# Patient Record
Sex: Male | Born: 1950
Health system: Southern US, Community
[De-identification: ages and names within clinical notes are randomized; demographics above are authoritative.]

## PROBLEM LIST (undated history)

## (undated) DIAGNOSIS — S83241A Other tear of medial meniscus, current injury, right knee, initial encounter: Secondary | ICD-10-CM

## (undated) DIAGNOSIS — M23261 Derangement of other lateral meniscus due to old tear or injury, right knee: Secondary | ICD-10-CM

## (undated) DIAGNOSIS — M1990 Unspecified osteoarthritis, unspecified site: Secondary | ICD-10-CM

## (undated) DIAGNOSIS — I499 Cardiac arrhythmia, unspecified: Secondary | ICD-10-CM

## (undated) HISTORY — DX: Other tear of medial meniscus, current injury, right knee, initial encounter: S83.241A

## (undated) HISTORY — PX: OTHER SURGICAL HISTORY: SHX169

## (undated) HISTORY — DX: Derangement of other lateral meniscus due to old tear or injury, right knee: M23.261

---

## 1985-04-08 HISTORY — PX: KNEE SURGERY: SHX244

## 1987-04-09 HISTORY — PX: VASECTOMY: SHX75

## 2018-01-19 ENCOUNTER — Ambulatory Visit (INDEPENDENT_AMBULATORY_CARE_PROVIDER_SITE_OTHER): Payer: Self-pay

## 2018-01-19 DIAGNOSIS — Z1211 Encounter for screening for malignant neoplasm of colon: Secondary | ICD-10-CM

## 2018-01-19 MED ORDER — NA SULFATE-K SULFATE-MG SULF 17.5-3.13-1.6 GM/177ML PO SOLN: 1.0000 | ORAL | 0 refills | Status: DC

## 2018-01-19 NOTE — Progress Notes (Signed)
Gastroenterology Pre-Procedure Review  Request Date:01/19/18 Requesting Physician: Dr.Gosrani- last tcs 6 years ago in [place], had polyps per pt. Not sure of the Dr's name. Will get Manuela Schwartz to attempt to get records.   PATIENT REVIEW QUESTIONS: The patient responded to the following health history questions as indicated:    1. Diabetes Melitis: no 2. Joint replacements in the past 12 months: no 3. Major health problems in the past 3 months: no 4. Has an artificial valve or MVP: no 5. Has a defibrillator: no 6. Has been advised in past to take antibiotics in advance of a procedure like teeth cleaning: no 7. Family history of colon cancer: no  8. Alcohol Use: yes (occasionally)  9. History of sleep apnea: no  10. History of coronary artery or other vascular stents placed within the last 12 months: no 11. History of any prior anesthesia complications: no    MEDICATIONS & ALLERGIES:    Patient reports the following regarding taking any blood thinners:   Plavix? no Aspirin? yes (81mg ) Coumadin? no Brilinta? no Xarelto? no Eliquis? no Pradaxa? no Savaysa? no Effient? no  Patient confirms/reports the following medications:  Current Outpatient Medications  Medication Sig Dispense Refill  . Ascorbic Acid (VITAMIN C) 100 MG tablet Take 100 mg by mouth daily.    Marland Kitchen aspirin EC 81 MG tablet Take 81 mg by mouth daily.    . cholecalciferol (VITAMIN D) 400 units TABS tablet Take 400 Units by mouth.    Marland Kitchen glucosamine-chondroitin 500-400 MG tablet Take 1 tablet by mouth 3 (three) times daily.    . psyllium (REGULOID) 0.52 g capsule Take 0.52 g by mouth daily.    Marland Kitchen testosterone cypionate (DEPOTESTOTERONE CYPIONATE) 100 MG/ML injection Inject into the muscle once a week. For IM use only     No current facility-administered medications for this visit.     Patient confirms/reports the following allergies:  Allergies  Allergen Reactions  . Amiodarone Rash  . Penicillins Rash    No  orders of the defined types were placed in this encounter.   AUTHORIZATION INFORMATION Primary Insurance: Unknown Jim #: [id number] Pre-Cert / Josem Kaufmann required: no   SCHEDULE INFORMATION: Procedure has been scheduled as follows:  Date: 03/10/18, Time: 7:30 Location: [facility] Dr.Rourk  This Gastroenterology Pre-Precedure Review Form is being routed to the following provider(s): Neil Crouch PA-C

## 2018-01-19 NOTE — Patient Instructions (Signed)
Harold West  05-Oct-1950 MRN: [id number]     Procedure Date: 03/10/18 Time to register: 6:30am Place to register: [facility] Procedure Time: 7:30am Scheduled provider: R. Garfield Cornea, MD    PREPARATION FOR COLONOSCOPY WITH SUPREP BOWEL PREP KIT  Note: Suprep Bowel Prep Kit is a split-dose (2day) regimen. Consumption of BOTH 6-ounce bottles is required for a complete prep.  Please notify us immediately if you are diabetic, take iron supplements, or if you are on Coumadin or any other blood thinners.                                                                                                                                                    2 DAYS BEFORE PROCEDURE:  DATE: 03/08/18   DAY: Sunday Begin clear liquid diet AFTER your lunch meal. NO SOLID FOODS after this point.  1 DAY BEFORE PROCEDURE:  DATE: 03/09/18   DAY: Monday Continue clear liquids the entire day - NO SOLID FOOD.    At 6:00pm: Complete steps 1 through 4 below, using ONE (1) 6-ounce bottle, before going to bed. Step 1:  Pour ONE (1) 6-ounce bottle of SUPREP liquid into the mixing container.  Step 2:  Add cool drinking water to the 16 ounce line on the container and mix.  Note: Dilute the solution concentrate as directed prior to use. Step 3:  DRINK ALL the liquid in the container. Step 4:  You MUST drink an additional two (2) or more 16 ounce containers of water over the next one (1) hour.   Continue clear liquids.  DAY OF PROCEDURE:   DATE: 03/10/18   DAY: Tuesday If you take medications for your heart, blood pressure, or breathing, you may take these medications.   5 hours before your procedure at :2:30am Step 1:  Pour ONE (1) 6-ounce bottle of SUPREP liquid into the mixing container.  Step 2:  Add cool drinking water to the 16 ounce line on the container and mix.  Note: Dilute the solution concentrate as directed prior to use. Step 3:  DRINK ALL the liquid in the container. Step 4:  You MUST  drink an additional two (2) or more 16 ounce containers of water over the next one (1) hour. You MUST complete the final glass of water at least 3 hours before your colonoscopy.   Nothing by mouth past 4:30am  You may take your morning medications with sip of water unless we have instructed otherwise.    Please see below for Dietary Information.  CLEAR LIQUIDS INCLUDE:  Water Jello (NOT red in color)   Ice Popsicles (NOT red in color)   Tea (sugar ok, no milk/cream) Powdered fruit flavored drinks  Coffee (sugar ok, no milk/cream) Gatorade/ Lemonade/ Kool-Aid  (NOT red in color)   Juice: apple, white grape, white cranberry Soft  drinks  Clear bullion, consomme, broth (fat free beef/chicken/vegetable)  Carbonated beverages (any kind)  Strained chicken noodle soup Hard Candy   Remember: Clear liquids are liquids that will allow you to see your fingers on the other side of a clear glass. Be sure liquids are NOT red in color, and not cloudy, but CLEAR.  DO NOT EAT OR DRINK ANY OF THE FOLLOWING:  Dairy products of any kind   Cranberry juice Tomato juice / V8 juice   Grapefruit juice Orange juice     Red grape juice  Do not eat any solid foods, including such foods as: cereal, oatmeal, yogurt, fruits, vegetables, creamed soups, eggs, bread, crackers, pureed foods in a blender, etc.   HELPFUL HINTS FOR DRINKING PREP SOLUTION:   Make sure prep is extremely cold. Mix and refrigerate the the morning of the prep. You may also put in the freezer.   You may try mixing some Crystal Light or Country Time Lemonade if you prefer. Mix in small amounts; add more if necessary.  Try drinking through a straw  Rinse mouth with water or a mouthwash between glasses, to remove after-taste.  Try sipping on a cold beverage /ice/ popsicles between glasses of prep.  Place a piece of sugar-free hard candy in mouth between glasses.  If you become nauseated, try consuming smaller amounts, or stretch out the  time between glasses. Stop for 30-60 minutes, then slowly start back drinking.     OTHER INSTRUCTIONS  You will need a responsible adult at least 66 years of age to accompany you and drive you home. This person must remain in the waiting room during your procedure. The hospital will cancel your procedure if you do not have a responsible adult with you.   1. Wear loose fitting clothing that is easily removed. 2. Leave jewelry and other valuables at home.  3. Remove all body piercing jewelry and leave at home. 4. Total time from sign-in until discharge is approximately 2-3 hours. 5. You should go home directly after your procedure and rest. You can resume normal activities the day after your procedure. 6. The day of your procedure you should not:  Drive  Make legal decisions  Operate machinery  Drink alcohol  Return to work   You may call the office (Dept: [phone]) before 5:00pm, or page the doctor on call [phone]) after 5:00pm, for further instructions, if necessary.   Insurance Information YOU WILL NEED TO CHECK WITH YOUR INSURANCE COMPANY FOR THE BENEFITS OF COVERAGE YOU HAVE FOR THIS PROCEDURE.  UNFORTUNATELY, NOT ALL INSURANCE COMPANIES HAVE BENEFITS TO COVER ALL OR PART OF THESE TYPES OF PROCEDURES.  IT IS YOUR RESPONSIBILITY TO CHECK YOUR BENEFITS, HOWEVER, WE WILL BE GLAD TO ASSIST YOU WITH ANY CODES YOUR INSURANCE COMPANY MAY NEED.    PLEASE NOTE THAT MOST INSURANCE COMPANIES WILL NOT COVER A SCREENING COLONOSCOPY FOR PEOPLE UNDER THE AGE OF 50  IF YOU HAVE BCBS INSURANCE, YOU MAY HAVE BENEFITS FOR A SCREENING COLONOSCOPY BUT IF POLYPS ARE FOUND THE DIAGNOSIS WILL CHANGE AND THEN YOU MAY HAVE A DEDUCTIBLE THAT WILL NEED TO BE MET. SO PLEASE MAKE SURE YOU CHECK YOUR BENEFITS FOR A SCREENING COLONOSCOPY AS WELL AS A DIAGNOSTIC COLONOSCOPY.

## 2018-01-20 NOTE — Progress Notes (Signed)
Ok to schedule.

## 2018-03-10 ENCOUNTER — Encounter (HOSPITAL_COMMUNITY): Payer: Self-pay | Admitting: *Deleted

## 2018-03-10 ENCOUNTER — Ambulatory Visit (HOSPITAL_COMMUNITY)
Admission: RE | Admit: 2018-03-10 | Discharge: 2018-03-10 | Disposition: A | Discharge status: Home / Self Care | Payer: Managed Care, Other (non HMO) | Source: Ambulatory Visit | Attending: Internal Medicine | Admitting: Internal Medicine

## 2018-03-10 ENCOUNTER — Other Ambulatory Visit: Payer: Self-pay

## 2018-03-10 ENCOUNTER — Encounter (HOSPITAL_COMMUNITY)
Admission: RE | Disposition: A | Discharge status: Home / Self Care | Payer: Self-pay | Source: Ambulatory Visit | Attending: Internal Medicine

## 2018-03-10 DIAGNOSIS — Z79899 Other long term (current) drug therapy: Secondary | ICD-10-CM | POA: Diagnosis not present

## 2018-03-10 DIAGNOSIS — Z1211 Encounter for screening for malignant neoplasm of colon: Secondary | ICD-10-CM | POA: Diagnosis present

## 2018-03-10 DIAGNOSIS — D120 Benign neoplasm of cecum: Secondary | ICD-10-CM | POA: Diagnosis not present

## 2018-03-10 DIAGNOSIS — Z86010 Personal history of colonic polyps: Secondary | ICD-10-CM | POA: Diagnosis not present

## 2018-03-10 DIAGNOSIS — Z7982 Long term (current) use of aspirin: Secondary | ICD-10-CM | POA: Diagnosis not present

## 2018-03-10 DIAGNOSIS — Z9079 Acquired absence of other genital organ(s): Secondary | ICD-10-CM | POA: Diagnosis not present

## 2018-03-10 HISTORY — PX: POLYPECTOMY: SHX5525

## 2018-03-10 HISTORY — PX: COLONOSCOPY: SHX5424

## 2018-03-10 SURGERY — COLONOSCOPY
Anesthesia: Moderate Sedation

## 2018-03-10 MED ORDER — MEPERIDINE HCL 50 MG/ML IJ SOLN
INTRAMUSCULAR | Status: AC
  Filled 2018-03-10: qty 1

## 2018-03-10 MED ORDER — MEPERIDINE HCL 100 MG/ML IJ SOLN
INTRAMUSCULAR | Status: DC | PRN
  Administered 2018-03-10: 25 mg

## 2018-03-10 MED ORDER — MIDAZOLAM HCL 5 MG/5ML IJ SOLN
INTRAMUSCULAR | Status: AC
  Filled 2018-03-10: qty 10

## 2018-03-10 MED ORDER — MIDAZOLAM HCL 5 MG/5ML IJ SOLN
INTRAMUSCULAR | Status: DC | PRN
  Administered 2018-03-10: 1 mg via INTRAVENOUS
  Administered 2018-03-10: 2 mg via INTRAVENOUS
  Administered 2018-03-10: 1 mg via INTRAVENOUS

## 2018-03-10 MED ORDER — STERILE WATER FOR IRRIGATION IR SOLN
Status: DC | PRN
  Administered 2018-03-10: 1.5 mL

## 2018-03-10 MED ORDER — ONDANSETRON HCL 4 MG/2ML IJ SOLN
INTRAMUSCULAR | Status: AC
  Filled 2018-03-10: qty 2

## 2018-03-10 MED ORDER — SODIUM CHLORIDE 0.9 % IV SOLN
INTRAVENOUS | Status: DC
  Administered 2018-03-10: 07:00:00 via INTRAVENOUS

## 2018-03-10 MED ORDER — ONDANSETRON HCL 4 MG/2ML IJ SOLN
INTRAMUSCULAR | Status: DC | PRN
  Administered 2018-03-10: 4 mg via INTRAVENOUS

## 2018-03-10 NOTE — Op Note (Signed)
[facility] Patient Name: Harold West Procedure Date: 03/10/2018 7:18 AM MRN: [id number] Date of Birth: 1950-08-12 Attending MD: Norvel Richards , MD CSN: [id number] Age: 67 Admit Type: Outpatient Procedure:                Colonoscopy Indications:              High risk colon cancer surveillance: Personal                            history of colonic polyps Providers:                Norvel Richards, MD, Lurline Del, RN, Gerome Sam, RN Referring MD:              Medicines:                Midazolam 4 mg IV, Meperidine 25 mg IV, Ondansetron                            4 mg IV Complications:            No immediate complications. Estimated Blood Loss:     Estimated blood loss: none. Procedure:                Pre-Anesthesia Assessment:                           - Prior to the procedure, a History and Physical                            was performed, and patient medications and                            allergies were reviewed. The patient's tolerance of                            previous anesthesia was also reviewed. The risks                            and benefits of the procedure and the sedation                            options and risks were discussed with the patient.                            All questions were answered, and informed consent                            was obtained. Prior Anticoagulants: The patient has                            taken no previous anticoagulant or antiplatelet  agents. ASA Grade Assessment: II - A patient with                            mild systemic disease. After reviewing the risks                            and benefits, the patient was deemed in                            satisfactory condition to undergo the procedure.                           After obtaining informed consent, the colonoscope                            was passed under direct vision. Throughout the                             procedure, the patient's blood pressure, pulse, and                            oxygen saturations were monitored continuously. The                            CF-HQ190L ([id number]) scope was introduced through                            the anus and advanced to the the terminal ileum,                            with identification of the appendiceal orifice and                            IC valve. The colonoscopy was performed without                            difficulty. The patient tolerated the procedure                            well. The quality of the bowel preparation was                            adequate. The ileocecal valve, appendiceal orifice,                            and rectum were photographed. The entire colon was                            well visualized. Scope In: 7:46:22 AM Scope Out: 8:01:30 AM Scope Withdrawal Time: 0 hours 10 minutes 9 seconds  Total Procedure Duration: 0 hours 15 minutes 8 seconds  Findings:      The perianal and digital rectal examinations were normal.      A 3 mm polyp was found in  the cecum. The polyp was sessile. The polyp       was removed with a cold snare. Resection and retrieval were complete.       Estimated blood loss was minimal.      The exam was otherwise without abnormality on direct and retroflexion       views. Impression:               - One 3 mm polyp in the cecum, removed with a cold                            snare. Resected and retrieved.                           - The examination was otherwise normal on direct                            and retroflexion views. Moderate Sedation:      Moderate (conscious) sedation was administered by the endoscopy nurse       and supervised by the endoscopist. The following parameters were       monitored: oxygen saturation, heart rate, blood pressure, respiratory       rate, EKG, adequacy of pulmonary ventilation, and response to care.       Total physician  intraservice time was 21 minutes. Recommendation:           - Patient has a contact number available for                            emergencies. The signs and symptoms of potential                            delayed complications were discussed with the                            patient. Return to normal activities tomorrow.                            Written discharge instructions were provided to the                            patient.                           - Resume previous diet.                           - Repeat colonoscopy date to be determined after                            pending pathology results are reviewed for                            surveillance.                           - Return to GI clinic (date not yet determined). Procedure Code(s):        ---  Professional ---                           [phone], Colonoscopy, flexible; with removal of                            tumor(s), polyp(s), or other lesion(s) by snare                            technique                           G0500, Moderate sedation services provided by the                            same physician or other qualified health care                            professional performing a gastrointestinal                            endoscopic service that sedation supports,                            requiring the presence of an independent trained                            observer to assist in the monitoring of the                            patient's level of consciousness and physiological                            status; initial 15 minutes of intra-service time;                            patient age 60 years or older (additional time may                            be reported with [phone], as appropriate) Diagnosis Code(s):        --- Professional ---                           Z86.010, Personal history of colonic polyps                           D12.0, Benign neoplasm of cecum CPT copyright 2018  American Medical Association. All rights reserved. The codes documented in this report are preliminary and upon coder review may  be revised to meet current compliance requirements. Harold West. Harold Oddo, MD Norvel Richards, MD 03/10/2018 8:06:27 AM This report has been signed electronically. Number of Addenda: 0

## 2018-03-10 NOTE — H&P (Signed)
_0 @   Primary Care Physician:  Doree Albee, MD Primary Gastroenterologist:  Dr. Gala Romney  Pre-Procedure History & Physical: HPI:  Harold West is a 67 y.o. male here for surveillance colonoscopy;  Last colonoscopy [place].No bowel symptoms.  Polyps removed previously.  History reviewed. No pertinent past medical history.  Past Surgical History:  Procedure Laterality Date  . KNEE SURGERY Right 1987  . Testicle removed Bilateral   . VASECTOMY  1989    Prior to Admission medications   Medication Sig Start Date End Date Taking? Authorizing Provider  Ascorbic Acid (VITAMIN C PO) Take 1 tablet by mouth daily.   Yes [provider]  aspirin EC 81 MG tablet Take 81 mg by mouth daily.   Yes [provider]  Calcium Polycarbophil (FIBER-CAPS PO) Take 5 capsules by mouth daily.   Yes [provider]  Cholecalciferol (VITAMIN D3 PO) Take 1 tablet by mouth daily.   Yes [provider]  Glucosamine HCl (GLUCOSAMINE PO) Take 1 tablet by mouth daily.   Yes [provider]  Na Sulfate-K Sulfate-Mg Sulf (SUPREP BOWEL PREP KIT) 17.5-3.13-1.6 GM/177ML SOLN Take 1 kit by mouth as directed. 01/19/18  Yes Mahala Menghini, PA-C  testosterone cypionate (DEPOTESTOSTERONE CYPIONATE) 200 MG/ML injection Inject 100 mg into the muscle every Thursday. 12/02/17  Yes [provider]    Allergies as of 01/19/2018 - Review Complete 01/19/2018  Allergen Reaction Noted  . Amiodarone Rash 01/19/2018  . Penicillins Rash 01/19/2018    Family History  Problem Relation Age of Onset  . Hypertension Mother   . Diabetes Mother   . Heart disease Father     Social History   Socioeconomic History  . Marital status: Married    Spouse name: Not on file  . Number of children: Not on file  . Years of education: Not on file  . Highest education level: Not on file  Occupational History  . Not on file  Social Needs  . Financial resource strain: Not  on file  . Food insecurity:    Worry: Not on file    Inability: Not on file  . Transportation needs:    Medical: Not on file    Non-medical: Not on file  Tobacco Use  . Smoking status: Never Smoker  . Smokeless tobacco: Never Used  Substance and Sexual Activity  . Alcohol use: Yes    Comment: Occasional  . Drug use: Never  . Sexual activity: Not on file  Lifestyle  . Physical activity:    Days per week: Not on file    Minutes per session: Not on file  . Stress: Not on file  Relationships  . Social connections:    Talks on phone: Not on file    Gets together: Not on file    Attends religious service: Not on file    Active member of club or organization: Not on file    Attends meetings of clubs or organizations: Not on file    Relationship status: Not on file  . Intimate partner violence:    Fear of current or ex partner: Not on file    Emotionally abused: Not on file    Physically abused: Not on file    Forced sexual activity: Not on file  Other Topics Concern  . Not on file  Social History Narrative  . Not on file    Review of Systems: See HPI, otherwise negative ROS  Physical Exam: BP 115/62   Pulse  73   Temp 97.8 F (36.6 C) (Oral)   Resp 14   Ht 6' 2.5" (1.892 m)   Wt 108.9 kg   SpO2 97%   BMI 30.40 kg/m  General:   Alert,  Well-developed, well-nourished, pleasant and cooperative in NAD Neck:  Supple; no masses or thyromegaly. No significant cervical adenopathy. Lungs:  Clear throughout to auscultation.   No wheezes, crackles, or rhonchi. No acute distress. Heart:  Regular rate and rhythm; no murmurs, clicks, rubs,  or gallops. Abdomen: Non-distended, normal bowel sounds.  Soft and nontender without appreciable mass or hepatosplenomegaly.  Pulses:  Normal pulses noted. Extremities:  Without clubbing or edema.  Impression/Plan:  Here for surveillance colonoscopy.  The risks, benefits, limitations, alternatives and imponderables have been reviewed with  the patient. Questions have been answered. All parties are agreeable.      Notice: This dictation was prepared with Dragon dictation along with smaller phrase technology. Any transcriptional errors that result from this process are unintentional and may not be corrected upon review.

## 2018-03-10 NOTE — Discharge Instructions (Signed)
Colon Polyps °Polyps are tissue growths inside the body. Polyps can grow in many places, including the large intestine (colon). A polyp may be a round bump or a mushroom-shaped growth. You could have one polyp or several. °Most colon polyps are noncancerous (benign). However, some colon polyps can become cancerous over time. °What are the causes? °The exact cause of colon polyps is not known. °What increases the risk? °This condition is more likely to develop in people who: °· Have a family history of colon cancer or colon polyps. °· Are older than 50 or older than 45 if they are African American. °· Have inflammatory bowel disease, such as ulcerative colitis or Crohn disease. °· Are overweight. °· Smoke cigarettes. °· Do not get enough exercise. °· Drink too much alcohol. °· Eat a diet that is: °? High in fat and red meat. °? Low in fiber. °· Had childhood cancer that was treated with abdominal radiation. ° °What are the signs or symptoms? °Most polyps do not cause symptoms. If you have symptoms, they may include: °· Blood coming from your rectum when having a bowel movement. °· Blood in your stool. The stool may look dark red or black. °· A change in bowel habits, such as constipation or diarrhea. ° °How is this diagnosed? °This condition is diagnosed with a colonoscopy. This is a procedure that uses a lighted, flexible scope to look at the inside of your colon. °How is this treated? °Treatment for this condition involves removing any polyps that are found. Those polyps will then be tested for cancer. If cancer is found, your health care provider will talk to you about options for colon cancer treatment. °Follow these instructions at home: °Diet °· Eat plenty of fiber, such as fruits, vegetables, and whole grains. °· Eat foods that are high in calcium and vitamin D, such as milk, cheese, yogurt, eggs, liver, fish, and broccoli. °· Limit foods high in fat, red meats, and processed meats, such as hot dogs, sausage,  bacon, and lunch meats. °· Maintain a healthy weight, or lose weight if recommended by your health care provider. °General instructions °· Do not smoke cigarettes. °· Do not drink alcohol excessively. °· Keep all follow-up visits as told by your health care provider. This is important. This includes keeping regularly scheduled colonoscopies. Talk to your health care provider about when you need a colonoscopy. °· Exercise every day or as told by your health care provider. °Contact a health care provider if: °· You have new or worsening bleeding during a bowel movement. °· You have new or increased blood in your stool. °· You have a change in bowel habits. °· You unexpectedly lose weight. °This information is not intended to replace advice given to you by your health care provider. Make sure you discuss any questions you have with your health care provider. °Document Released: 12/20/2003 Document Revised: 08/31/2015 Document Reviewed: 02/13/2015 °Elsevier Interactive Patient Education © 2018 [employer] ° °Colonoscopy °Discharge Instructions ° °Read the instructions outlined below and refer to this sheet in the next few weeks. These discharge instructions provide you with general information on caring for yourself after you leave the hospital. Your doctor may also give you specific instructions. While your treatment has been planned according to the most current medical practices available, unavoidable complications occasionally occur. If you have any problems or questions after discharge, call Dr. Rourk at [phone]. °ACTIVITY °· You may resume your regular activity, but move at a slower pace for the next 24   hours.  °· Take frequent rest periods for the next 24 hours.  °· Walking will help get rid of the air and reduce the bloated feeling in your belly (abdomen).  °· No driving for 24 hours (because of the medicine (anesthesia) used during the test).   °· Do not sign any important legal documents or operate any  machinery for 24 hours (because of the anesthesia used during the test).  °NUTRITION °· Drink plenty of fluids.  °· You may resume your normal diet as instructed by your doctor.  °· Begin with a light meal and progress to your normal diet. Heavy or fried foods are harder to digest and may make you feel sick to your stomach (nauseated).  °· Avoid alcoholic beverages for 24 hours or as instructed.  °MEDICATIONS °· You may resume your normal medications unless your doctor tells you otherwise.  °WHAT YOU CAN EXPECT TODAY °· Some feelings of bloating in the abdomen.  °· Passage of more gas than usual.  °· Spotting of blood in your stool or on the toilet paper.  °IF YOU HAD POLYPS REMOVED DURING THE COLONOSCOPY: °· No aspirin products for 7 days or as instructed.  °· No alcohol for 7 days or as instructed.  °· Eat a soft diet for the next 24 hours.  °FINDING OUT THE RESULTS OF YOUR TEST °Not all test results are available during your visit. If your test results are not back during the visit, make an appointment with your caregiver to find out the results. Do not assume everything is normal if you have not heard from your caregiver or the medical facility. It is important for you to follow up on all of your test results.  °SEEK IMMEDIATE MEDICAL ATTENTION IF: °· You have more than a spotting of blood in your stool.  °· Your belly is swollen (abdominal distention).  °· You are nauseated or vomiting.  °· You have a temperature over 101.  °· You have abdominal pain or discomfort that is severe or gets worse throughout the day.  ° ° ° °Colon polyp information provided ° °Further recommendations to follow pending review of pathology report °

## 2018-03-12 ENCOUNTER — Encounter: Payer: Self-pay | Admitting: Internal Medicine

## 2018-03-23 ENCOUNTER — Encounter (HOSPITAL_COMMUNITY): Payer: Self-pay | Admitting: Internal Medicine

## 2018-06-15 ENCOUNTER — Encounter (INDEPENDENT_AMBULATORY_CARE_PROVIDER_SITE_OTHER): Payer: Self-pay | Admitting: Internal Medicine

## 2018-07-02 ENCOUNTER — Encounter (INDEPENDENT_AMBULATORY_CARE_PROVIDER_SITE_OTHER): Payer: Self-pay | Admitting: Internal Medicine

## 2018-07-08 ENCOUNTER — Ambulatory Visit (INDEPENDENT_AMBULATORY_CARE_PROVIDER_SITE_OTHER): Payer: Managed Care, Other (non HMO) | Admitting: Internal Medicine

## 2018-11-03 ENCOUNTER — Other Ambulatory Visit: Payer: Managed Care, Other (non HMO)

## 2018-11-03 ENCOUNTER — Other Ambulatory Visit: Payer: Self-pay

## 2018-11-03 DIAGNOSIS — Z20822 Contact with and (suspected) exposure to covid-19: Secondary | ICD-10-CM

## 2018-11-05 LAB — NOVEL CORONAVIRUS, NAA: SARS-CoV-2, NAA: NOT DETECTED

## 2019-01-06 ENCOUNTER — Ambulatory Visit (INDEPENDENT_AMBULATORY_CARE_PROVIDER_SITE_OTHER): Payer: Managed Care, Other (non HMO) | Admitting: Internal Medicine

## 2019-02-24 ENCOUNTER — Ambulatory Visit (INDEPENDENT_AMBULATORY_CARE_PROVIDER_SITE_OTHER): Payer: Medicare HMO | Admitting: Internal Medicine

## 2019-02-24 ENCOUNTER — Encounter (INDEPENDENT_AMBULATORY_CARE_PROVIDER_SITE_OTHER): Payer: Self-pay | Admitting: Internal Medicine

## 2019-02-24 ENCOUNTER — Other Ambulatory Visit: Payer: Self-pay

## 2019-02-24 DIAGNOSIS — E559 Vitamin D deficiency, unspecified: Secondary | ICD-10-CM | POA: Diagnosis not present

## 2019-02-24 DIAGNOSIS — E291 Testicular hypofunction: Secondary | ICD-10-CM | POA: Diagnosis not present

## 2019-02-24 DIAGNOSIS — E669 Obesity, unspecified: Secondary | ICD-10-CM | POA: Diagnosis not present

## 2019-02-24 DIAGNOSIS — Z23 Encounter for immunization: Secondary | ICD-10-CM | POA: Diagnosis not present

## 2019-02-24 HISTORY — DX: Obesity, unspecified: E66.9

## 2019-02-24 HISTORY — DX: Vitamin D deficiency, unspecified: E55.9

## 2019-02-24 HISTORY — DX: Testicular hypofunction: E29.1

## 2019-02-24 NOTE — Patient Instructions (Signed)
[facility] Dietary Recommendations for Weight Loss What to Avoid . Avoid added sugars o Often added sugar can be found in processed foods such as many condiments, dry cereals, cakes, cookies, chips, crisps, crackers, candies, sweetened drinks, etc.  o Read labels and AVOID/DECREASE use of foods with the following in their ingredient list: Sugar, fructose, high fructose corn syrup, sucrose, glucose, maltose, dextrose, molasses, cane sugar, brown sugar, any type of syrup, agave nectar, etc.   . Avoid snacking in between meals . Avoid foods made with flour o If you are going to eat food made with flour, choose those made with whole-grains; and, minimize your consumption as much as is tolerable . Avoid processed foods o These foods are generally stocked in the middle of the grocery store. Focus on shopping on the perimeter of the grocery.  What to Include . Vegetables o GREEN LEAFY VEGETABLES: Kale, spinach, mustard greens, collard greens, cabbage, broccoli, etc. o OTHER: Asparagus, cauliflower, eggplant, carrots, peas, Brussel sprouts, tomatoes, bell peppers, zucchini, beets, cucumbers, etc. . Grains, seeds, and legumes o Beans: kidney beans, black eyed peas, garbanzo beans, black beans, pinto beans, etc. o Whole, unrefined grains: brown rice, barley, bulgur, oatmeal, etc. . Healthy fats  o Avoid highly processed fats such as vegetable oil o Examples of healthy fats: avocado, olives, virgin olive oil, dark chocolate (?72% Cocoa), nuts (peanuts, almonds, walnuts, cashews, pecans, etc.) . Low - Moderate Intake of Animal Sources of Protein o Meat sources: chicken, turkey, salmon, tuna. Limit to 4 ounces of meat at one time. o Consider limiting dairy sources, but when choosing dairy focus on: PLAIN Greek yogurt, cottage cheese, high-protein milk . Fruit o Choose berries  When to Eat . Intermittent Fasting: o Choosing not to eat for a specific time period, but DO FOCUS ON HYDRATION  when fasting o Multiple Techniques: - Time Restricted Eating: eat 3 meals in a day, each meal lasting no more than 60 minutes, no snacks between meals - 16-18 hour fast: fast for 16 to 18 hours up to 7 days a week. Often suggested to start with 2-3 nonconsecutive days per week.  . Remember the time you sleep is counted as fasting.  . Examples of eating schedule: Fast from 7:00pm-11:00am. Eat between 11:00am-7:00pm.  - 24-hour fast: fast for 24 hours up to every other day. Often suggested to start with 1 day per week . Remember the time you sleep is counted as fasting . Examples of eating schedule:  o Eating day: eat 2-3 meals on your eating day. If doing 2 meals, each meal should last no more than 90 minutes. If doing 3 meals, each meal should last no more than 60 minutes. Finish last meal by 7:00pm. o Fasting day: Fast until 7:00pm.  o IF YOU FEEL UNWELL FOR ANY REASON/IN ANY WAY WHEN FASTING, STOP FASTING BY EATING A NUTRITIOUS SNACK OR LIGHT MEAL o ALWAYS FOCUS ON HYDRATION DURING FASTS - Acceptable Hydration sources: water, broths, tea/coffee (black tea/coffee is best but using a small amount of whole-fat dairy products in coffee/tea is acceptable).  - Poor Hydration Sources: anything with sugar or artificial sweeteners added to it  These recommendations have been developed for patients that are actively receiving medical care from either Dr.  or Sarah Gray, DNP, NP-C at  [facility]. These recommendations are developed for patients with specific medical conditions and are not meant to be distributed or used by others that are not actively receiving care from either provider listed   above at  [facility]. It is not appropriate to participate in the above eating plans without proper medical supervision.   Reference: Fung, J. The obesity code. [place]: [place]; 2016.   

## 2019-02-24 NOTE — Progress Notes (Signed)
Metrics: Intervention Frequency ACO  Documented Smoking Status Yearly  Screened one or more times in 24 months  Cessation Counseling or  Active cessation medication Past 24 months  Past 24 months   Guideline developer: UpToDate (See UpToDate for funding source) Date Released: 2014       Wellness Office Visit  Subjective:  Patient ID: Harold West, male    DOB: 04/10/1950  Age: 68 y.o. MRN: [id number]  CC: This man comes in for follow-up of hypogonadism, obesity and vitamin D deficiency. HPI  Unfortunately, he has gained weight and he admits he has not been consistent with nutrition and has not been exercising either. He is tolerating testosterone therapy twice a week but has not noticed improvement in sex drive. He is taking vitamin D3 supplementation for vitamin D deficiency. Past Medical History:  Diagnosis Date  . Obesity (BMI 30-39.9) 02/24/2019  . Testicular failure 02/24/2019  . Vitamin D deficiency disease 02/24/2019      Family History  Problem Relation Age of Onset  . Hypertension Mother   . Diabetes Mother   . Heart disease Father     Social History   Social History Narrative   Married for 43 years.Lives with wife.Retired August 2020.   Social History   Tobacco Use  . Smoking status: Never Smoker  . Smokeless tobacco: Never Used  Substance Use Topics  . Alcohol use: Yes    Comment: Occasional    Current Meds  Medication Sig  . Ascorbic Acid (VITAMIN C PO) Take 1 tablet by mouth daily.  Marland Kitchen aspirin EC 81 MG tablet Take 81 mg by mouth daily.  . Calcium Polycarbophil (FIBER-CAPS PO) Take 5 capsules by mouth daily.  . Cholecalciferol (VITAMIN D3) 125 MCG (5000 UT) TABS Take 1 tablet by mouth daily.   . Glucosamine HCl (GLUCOSAMINE PO) Take 1 tablet by mouth daily.  Marland Kitchen testosterone cypionate (DEPOTESTOSTERONE CYPIONATE) 200 MG/ML injection Inject 100 mg into the muscle 2 (two) times a week.      Nutrition  Not consistent. Sleep  Adequate.   Exercise  None regular. Bio Identical Hormones  Testosterone therapy is being used off label for symptoms of testosterone deficiency and benefits that it produces based on several studies.  These benefits include decreasing body fat, increasing in lean muscle mass and increasing in bone density.  There is improvement of memory, cognition.  There is improvement in exercise tolerance and endurance.  Testosterone therapy has also been shown to be protective against coronary artery disease, cerebrovascular disease, diabetes, hypertension and degenerative joint disease. I have discussed with the patient the FDA warnings regarding testosterone therapy, benefits and side effects and modes of administration as well as monitoring blood levels and side effects  on a regular basis The patient is agreeable that testosterone therapy should be an integral part of his/her wellness,quality of life and prevention of chronic disease.  Objective:   Today's Vitals: BP 120/80 (BP Location: Right Arm, Patient Position: Sitting, Cuff Size: Large)   Pulse 72   Temp (!) 97.3 F (36.3 C) (Temporal)   Resp 18   Ht 6\' 3"  (1.905 m)   Wt 293 lb (132.9 kg)   SpO2 96%   BMI 36.62 kg/m  Vitals with BMI 02/24/2019 06/02/2018 03/10/2018  Height 6\' 3"  6\' 3"  -  Weight 293 lbs 278 lbs 10 oz -  BMI 99991111 123XX123 -  Systolic 123456 0000000 [id number]  Diastolic 80 78 59  Pulse 72 84 -     Physical  Exam    He looks systemically well but he has gained 15 pounds since February of this year.  He is alert and orientated without any focal neurological signs.   Assessment   1. Need for immunization against influenza   2. Testicular failure   3. Vitamin D deficiency disease   4. Obesity (BMI 30-39.9)       Tests ordered Orders Placed This Encounter  Procedures  . Flu Vaccine QUAD High Dose(Fluad)     Plan: 1. He will continue with testosterone therapy for the time being. 2. I think the main focus should be for him to lose  weight and we discussed again nutrition and spent most of the visit discussing this. 3. I do not think he requires any blood work at this present time.  High-dose influenza vaccination was given today. 4. Follow-up in about 4 months time. 5. Today I spent 25 to 30 minutes with this patient face-to-face, more than 50% of the time was involved in discussing nutrition and the importance of losing weight.   No orders of the defined types were placed in this encounter.   Doree Albee, MD

## 2019-05-31 DIAGNOSIS — R69 Illness, unspecified: Secondary | ICD-10-CM | POA: Diagnosis not present

## 2019-06-12 ENCOUNTER — Other Ambulatory Visit (INDEPENDENT_AMBULATORY_CARE_PROVIDER_SITE_OTHER): Payer: Self-pay | Admitting: Internal Medicine

## 2019-06-24 ENCOUNTER — Ambulatory Visit (INDEPENDENT_AMBULATORY_CARE_PROVIDER_SITE_OTHER): Payer: Medicare HMO | Admitting: Internal Medicine

## 2019-07-07 ENCOUNTER — Other Ambulatory Visit: Payer: Self-pay

## 2019-07-07 ENCOUNTER — Encounter (INDEPENDENT_AMBULATORY_CARE_PROVIDER_SITE_OTHER): Payer: Self-pay | Admitting: Internal Medicine

## 2019-07-07 ENCOUNTER — Ambulatory Visit (INDEPENDENT_AMBULATORY_CARE_PROVIDER_SITE_OTHER): Payer: Medicare HMO | Admitting: Internal Medicine

## 2019-07-07 VITALS — BP 140/100 | HR 73 | Temp 97.2°F | Ht 74.5 in | Wt 308.6 lb

## 2019-07-07 DIAGNOSIS — E291 Testicular hypofunction: Secondary | ICD-10-CM

## 2019-07-07 DIAGNOSIS — R5383 Other fatigue: Secondary | ICD-10-CM | POA: Diagnosis not present

## 2019-07-07 DIAGNOSIS — R5381 Other malaise: Secondary | ICD-10-CM | POA: Diagnosis not present

## 2019-07-07 DIAGNOSIS — E559 Vitamin D deficiency, unspecified: Secondary | ICD-10-CM

## 2019-07-07 NOTE — Progress Notes (Signed)
Metrics: Intervention Frequency ACO  Documented Smoking Status Yearly  Screened one or more times in 24 months  Cessation Counseling or  Active cessation medication Past 24 months  Past 24 months   Guideline developer: UpToDate (See UpToDate for funding source) Date Released: 2014       Wellness Office Visit  Subjective:  Patient ID: Harold West, male    DOB: 22-Dec-1950  Age: 69 y.o. MRN: [id number]  CC: This man comes in for follow-up of hypogonadism, obesity and vitamin D deficiency. HPI  He has gained weight and he realizes he has not been eating healthy foods.  His weakness is candy/sugary foods. He continues on testosterone therapy which he is tolerating very well.  Past Medical History:  Diagnosis Date  . Obesity (BMI 30-39.9) 02/24/2019  . Testicular failure 02/24/2019  . Vitamin D deficiency disease 02/24/2019      Family History  Problem Relation Age of Onset  . Hypertension Mother   . Diabetes Mother   . Heart disease Father     Social History   Social History Narrative   Married for 43 years.Lives with wife.Retired August 2020.   Social History   Tobacco Use  . Smoking status: Never Smoker  . Smokeless tobacco: Never Used  Substance Use Topics  . Alcohol use: Yes    Comment: Occasional    Current Meds  Medication Sig  . Ascorbic Acid (VITAMIN C PO) Take 1 tablet by mouth daily.  Marland Kitchen aspirin EC 81 MG tablet Take 81 mg by mouth daily.  . Calcium Polycarbophil (FIBER-CAPS PO) Take 5 capsules by mouth daily.  . Cholecalciferol (VITAMIN D3) 125 MCG (5000 UT) TABS Take 1 tablet by mouth daily.   . Glucosamine HCl (GLUCOSAMINE PO) Take 1 tablet by mouth daily.  Marland Kitchen testosterone cypionate (DEPOTESTOSTERONE CYPIONATE) 200 MG/ML injection INJECT 0.5 ML INTO THE MUSCLE TWICE A WEEK       Objective:   Today's Vitals: BP (!) 140/100 (BP Location: Right Arm, Patient Position: Sitting, Cuff Size: Normal)   Pulse 73   Temp (!) 97.2 F (36.2 C) (Temporal)    Ht 6' 2.5" (1.892 m)   Wt (!) 308 lb 9.6 oz (140 kg)   SpO2 96%   BMI 39.09 kg/m  Vitals with BMI 07/07/2019 02/24/2019 06/02/2018  Height 6' 2.5" 6\' 3"  6\' 3"   Weight 308 lbs 10 oz 293 lbs 278 lbs 10 oz  BMI 39.1 99991111 123XX123  Systolic [id number] 123456 0000000  Diastolic 123XX123 80 78  Pulse 73 72 84     Physical Exam   He looks systemically well but he has gained 15 pounds since the last visit in November.  He is almost morbidly obese now.  Blood pressure is excellent.    Assessment   1. Testicular failure   2. Vitamin D deficiency disease   3. Malaise and fatigue       Tests ordered Orders Placed This Encounter  Procedures  . COMPLETE METABOLIC PANEL WITH GFR  . CBC  . T3, free  . Testosterone Total,Free,Bio, Males  . VITAMIN D 25 Hydroxy (Vit-D Deficiency, Fractures)     Plan: 1. Blood work is ordered above. 2. He will continue with testosterone therapy for the time being we will see if we need to adjust the levels. 3. Further recommendations will depend on blood results.  We discussed nutrition again and he is definitely keen to go back to possibly a ketogenic diet.  We talked about intermittent fasting and also  a plant-based diet. 4. Follow-up for an annual physical exam in 4 months time.  I spent 30 minutes with this patient today discussing the above.   No orders of the defined types were placed in this encounter.   Doree Albee, MD

## 2019-07-08 LAB — COMPLETE METABOLIC PANEL WITH GFR
AG Ratio: 1.8 (calc) (ref 1.0–2.5)
ALT: 23 U/L (ref 9–46)
AST: 20 U/L (ref 10–35)
Albumin: 4.4 g/dL (ref 3.6–5.1)
Alkaline phosphatase (APISO): 52 U/L (ref 35–144)
BUN: 16 mg/dL (ref 7–25)
CO2: 31 mmol/L (ref 20–32)
Calcium: 10.2 mg/dL (ref 8.6–10.3)
Chloride: 103 mmol/L (ref 98–110)
Creat: 1 mg/dL (ref 0.70–1.25)
GFR, Est African American: 90 mL/min/{1.73_m2} (ref >=60)
GFR, Est Non African American: 78 mL/min/{1.73_m2} (ref >=60)
Globulin: 2.5 g/dL (calc) (ref 1.9–3.7)
Glucose, Bld: 92 mg/dL (ref 65–99)
Potassium: 4.6 mmol/L (ref 3.5–5.3)
Sodium: 141 mmol/L (ref 135–146)
Total Bilirubin: 0.8 mg/dL (ref 0.2–1.2)
Total Protein: 6.9 g/dL (ref 6.1–8.1)

## 2019-07-08 LAB — TESTOSTERONE TOTAL,FREE,BIO, MALES
Albumin: 4.4 g/dL (ref 3.6–5.1)
Sex Hormone Binding: 34 nmol/L (ref 22–77)
Testosterone, Bioavailable: 66.2 ng/dL — ABNORMAL LOW (ref >=110.0)
Testosterone, Free: 32.9 pg/mL — ABNORMAL LOW (ref 46.0–224.0)
Testosterone: 267 ng/dL (ref 250–827)

## 2019-07-08 LAB — CBC
HCT: 45.5 % (ref 38.5–50.0)
Hemoglobin: 14.9 g/dL (ref 13.2–17.1)
MCH: 27.3 pg (ref 27.0–33.0)
MCHC: 32.7 g/dL (ref 32.0–36.0)
MCV: 83.5 fL (ref 80.0–100.0)
MPV: 10.9 fL (ref 7.5–12.5)
Platelets: 212 10*3/uL (ref 140–400)
RBC: 5.45 10*6/uL (ref 4.20–5.80)
RDW: 13.9 % (ref 11.0–15.0)
WBC: 7.3 10*3/uL (ref 3.8–10.8)

## 2019-07-08 LAB — T3, FREE: T3, Free: 3.1 pg/mL (ref 2.3–4.2)

## 2019-07-08 LAB — VITAMIN D 25 HYDROXY (VIT D DEFICIENCY, FRACTURES): Vit D, 25-Hydroxy: 31 ng/mL (ref 30–100)

## 2019-07-08 NOTE — Progress Notes (Signed)
Patient called.  Pt has not been taking as directed. Wife in background of call. Stated is spermatic on taking both meds. She will make sure is taking better going forward.

## 2019-07-21 ENCOUNTER — Ambulatory Visit: Payer: Medicare HMO | Attending: Internal Medicine

## 2019-07-21 DIAGNOSIS — Z20822 Contact with and (suspected) exposure to covid-19: Secondary | ICD-10-CM

## 2019-07-22 ENCOUNTER — Other Ambulatory Visit: Payer: Self-pay

## 2019-07-22 ENCOUNTER — Encounter (INDEPENDENT_AMBULATORY_CARE_PROVIDER_SITE_OTHER): Payer: Self-pay | Admitting: Internal Medicine

## 2019-07-22 ENCOUNTER — Telehealth (INDEPENDENT_AMBULATORY_CARE_PROVIDER_SITE_OTHER): Payer: Medicare HMO | Admitting: Internal Medicine

## 2019-07-22 VITALS — BP 145/87 | HR 96 | Temp 97.7°F | Resp 18 | Ht 72.0 in

## 2019-07-22 DIAGNOSIS — J40 Bronchitis, not specified as acute or chronic: Secondary | ICD-10-CM

## 2019-07-22 LAB — SARS-COV-2, NAA 2 DAY TAT

## 2019-07-22 LAB — NOVEL CORONAVIRUS, NAA: SARS-CoV-2, NAA: NOT DETECTED

## 2019-07-22 MED ORDER — AZITHROMYCIN 250 MG PO TABS: ORAL_TABLET | ORAL | 0 refills | Status: DC

## 2019-07-22 NOTE — Progress Notes (Signed)
Metrics: Intervention Frequency ACO  Documented Smoking Status Yearly  Screened one or more times in 24 months  Cessation Counseling or  Active cessation medication Past 24 months  Past 24 months   Guideline developer: UpToDate (See UpToDate for funding source) Date Released: 2014       Wellness Office Visit  Subjective:  Patient ID: Harold West, male    DOB: [birth year]  Age: 69 y.o. MRN: [id number]  CC: This is an audio telemedicine visit with the permission of the patient who is at home and I am in my office.  I was able to recognize the patient using 2 identifiers. His chief complaint is productive cough. HPI  He underwent second dose of COVID-19 vaccination approximately 2 weeks ago and he says he has not felt well since that time.  He feels tired but is now having a productive cough of brown/green sputum.  He denies any dyspnea or fever or body aches.  He is not wheezing according to him. Past Medical History:  Diagnosis Date  . Obesity (BMI 30-39.9) 02/24/2019  . Testicular failure 02/24/2019  . Vitamin D deficiency disease 02/24/2019      Family History  Problem Relation Age of Onset  . Hypertension Mother   . Diabetes Mother   . Heart disease Father     Social History   Social History Narrative   Married for 43 years.Lives with wife.Retired August 2020.   Social History   Tobacco Use  . Smoking status: Never Smoker  . Smokeless tobacco: Never Used  Substance Use Topics  . Alcohol use: Yes    Comment: Occasional    Current Meds  Medication Sig  . Ascorbic Acid (VITAMIN C PO) Take 1 tablet by mouth daily.  Marland Kitchen aspirin EC 81 MG tablet Take 81 mg by mouth daily.  . Calcium Polycarbophil (FIBER-CAPS PO) Take 5 capsules by mouth daily.  . Cholecalciferol (VITAMIN D3) 125 MCG (5000 UT) TABS Take 1 tablet by mouth daily.   . Glucosamine HCl (GLUCOSAMINE PO) Take 1 tablet by mouth daily.  Marland Kitchen testosterone cypionate (DEPOTESTOSTERONE CYPIONATE) 200 MG/ML injection  INJECT 0.5 ML INTO THE MUSCLE TWICE A WEEK       Objective:   Today's Vitals: BP (!) 145/87 (BP Location: Right Arm, Patient Position: Sitting, Cuff Size: Normal)   Pulse 96   Temp 97.7 F (36.5 C) (Temporal)   Resp 18   Ht 6' (1.829 m)   BMI 41.85 kg/m  Vitals with BMI 07/22/2019 07/07/2019 02/24/2019  Height 6\' 0"  6' 2.5" 6\' 3"   Weight - 308 lbs 10 oz 293 lbs  BMI - [id number] 99991111  Systolic Q000111Q [id number] 123456  Diastolic 87 123XX123 80  Pulse 96 73 72     Physical Exam   His speech on the phone appears normal and he appears to be alert and orientated.    Assessment   1. Bronchitis       Tests ordered No orders of the defined types were placed in this encounter.    Plan: 1. I will treat him as bronchitis with Zithromax and if he does not improve in the next week or so, he will let me know. 2. This phone call lasted 5 minutes and 21 seconds.   Meds ordered this encounter  Medications  . azithromycin (ZITHROMAX) 250 MG tablet    Sig: Take 2 tablets the first day and then 1 tablet every day for the next 4 days    Dispense:  6 tablet  Refill:  0    Kloie Whiting Luther Parody, MD

## 2019-09-02 ENCOUNTER — Ambulatory Visit (INDEPENDENT_AMBULATORY_CARE_PROVIDER_SITE_OTHER): Payer: Medicare HMO | Admitting: Nurse Practitioner

## 2019-09-02 ENCOUNTER — Other Ambulatory Visit: Payer: Self-pay

## 2019-09-02 ENCOUNTER — Ambulatory Visit (HOSPITAL_COMMUNITY)
Admission: RE | Admit: 2019-09-02 | Discharge: 2019-09-02 | Disposition: A | Discharge status: Home / Self Care | Payer: Medicare HMO | Source: Ambulatory Visit | Attending: Nurse Practitioner | Admitting: Nurse Practitioner

## 2019-09-02 VITALS — BP 144/78 | HR 95 | Temp 98.7°F | Resp 16 | Ht 72.0 in | Wt 307.4 lb

## 2019-09-02 DIAGNOSIS — M25561 Pain in right knee: Secondary | ICD-10-CM

## 2019-09-02 MED ORDER — PREDNISONE 20 MG PO TABS: 40.0000 mg | ORAL_TABLET | Freq: Every day | ORAL | 0 refills | Status: DC

## 2019-09-02 NOTE — Progress Notes (Signed)
Subjective:  Patient ID: Harold West, male    DOB: 11-06-1950  Age: 69 y.o. MRN: [id number]  CC:  Chief Complaint  Patient presents with  . Knee Pain    right knee pain started about 2 weeks ago, had a previous softball injury and surgery years ago and its started bothering him again. Pain is constant and severity varies based on activity. Steps and walking make it worse but pain is constant aching       HPI  This patient arrives today for an acute visit for the above.  He tells me that his right knee has been bothering him for approximately 2 weeks.  He tells me about 40 years ago he fell on his knee while playing softball.  At that time he experienced a significant injury and his "knee popped up" which required surgical intervention.  Since then it has hurt intermittently, however the severity of the pain has increased over the last 2 weeks.  He has not had any new traumatic events that he can pinpoint as to the cause of this pain.  He tells me the pain is constant, he describes it as achy, and says the severity is about 7.5 out of 10.  He has tried Advil with some mild relief in his pain.  He denies any weakness, fever, rashes, redness, or history of gout.  He tells me that sometimes he feels like his knee is going to "give away".   Past Medical History:  Diagnosis Date  . Obesity (BMI 30-39.9) 02/24/2019  . Testicular failure 02/24/2019  . Vitamin D deficiency disease 02/24/2019      Family History  Problem Relation Age of Onset  . Hypertension Mother   . Diabetes Mother   . Heart disease Father     Social History   Social History Narrative   Married for 43 years.Lives with wife.Retired August 2020.   Social History   Tobacco Use  . Smoking status: Never Smoker  . Smokeless tobacco: Never Used  Substance Use Topics  . Alcohol use: Yes    Comment: Occasional     Current Meds  Medication Sig  . Ascorbic Acid (VITAMIN C PO) Take 1 tablet by mouth daily.    Marland Kitchen aspirin EC 81 MG tablet Take 81 mg by mouth daily.  . Calcium Polycarbophil (FIBER-CAPS PO) Take 5 capsules by mouth daily.  . Cholecalciferol (VITAMIN D3) 125 MCG (5000 UT) TABS Take 1 tablet by mouth daily.   . Glucosamine HCl (GLUCOSAMINE PO) Take 1 tablet by mouth daily.  Marland Kitchen testosterone cypionate (DEPOTESTOSTERONE CYPIONATE) 200 MG/ML injection INJECT 0.5 ML INTO THE MUSCLE TWICE A WEEK    ROS:  Negative unless otherwise stated in HPI   Objective:   Today's Vitals: BP (!) 144/78   Pulse 95   Temp 98.7 F (37.1 C) (Temporal)   Resp 16   Ht 6' (1.829 m)   Wt (!) 307 lb 6.4 oz (139.4 kg)   SpO2 96%   BMI 41.69 kg/m  Vitals with BMI 09/02/2019 07/22/2019 07/07/2019  Height 6\' 0"  6\' 0"  6' 2.5"  Weight 307 lbs 6 oz - 308 lbs 10 oz  BMI [id number] - A999333  Systolic 123456 Q000111Q [id number]  Diastolic 78 87 123XX123  Pulse 95 96 73     Physical Exam Musculoskeletal:     Right knee: Swelling present. No erythema, ecchymosis, lacerations, bony tenderness or crepitus. Normal range of motion. Normal alignment.     Left knee:  Normal.          Assessment and Plan   1. Acute pain of right knee      Plan: 1.  I will send him for x-ray of the knee and will start him on a course of prednisone.  I recommended that he avoid Advil and prednisone but instead take Tylenol 500 to 1000 mg every 8 hours while on the prednisone.  I did warn him of common side effects of prednisone.  We did discuss that pending x-ray results and/or if pain is not resolved with this management we may need to consider further evaluation with either blood work or referral to orthopedic specialist.  He tells me he understands.   Tests ordered Orders Placed This Encounter  Procedures  . DG Knee Complete 4 Views Right      Meds ordered this encounter  Medications  . predniSONE (DELTASONE) 20 MG tablet    Sig: Take 2 tablets (40 mg total) by mouth daily with breakfast.    Dispense:  10 tablet    Refill:  0    Order  Specific Question:   Supervising Provider    Answer:   Doree Albee [id number]    Patient to follow-up in July as scheduled  Ailene Ards, NP

## 2019-09-13 ENCOUNTER — Encounter (INDEPENDENT_AMBULATORY_CARE_PROVIDER_SITE_OTHER): Payer: Self-pay | Admitting: Nurse Practitioner

## 2019-09-13 ENCOUNTER — Telehealth (INDEPENDENT_AMBULATORY_CARE_PROVIDER_SITE_OTHER): Payer: Self-pay

## 2019-09-13 NOTE — Telephone Encounter (Signed)
Harold West is calling asking if Harold West would return his call at #  [phone] ?

## 2019-09-13 NOTE — Telephone Encounter (Signed)
I attempted to call this patient on the number provided, but I got a message that stated "I am sorry but your call cannot be completed at this time, please hang up and try again".  Thus I was not able to leave a voicemail.  Hopefully the patient will call back.

## 2019-09-16 NOTE — Telephone Encounter (Signed)
Harold West called back and is asking what else can he do for the knee pain, he is alternating a knee brace some days please advise?

## 2019-09-22 ENCOUNTER — Other Ambulatory Visit (INDEPENDENT_AMBULATORY_CARE_PROVIDER_SITE_OTHER): Payer: Self-pay | Admitting: Internal Medicine

## 2019-09-22 DIAGNOSIS — M25561 Pain in right knee: Secondary | ICD-10-CM

## 2019-10-07 ENCOUNTER — Encounter: Payer: Self-pay | Admitting: Orthopaedic Surgery

## 2019-10-07 ENCOUNTER — Ambulatory Visit: Payer: Medicare HMO | Admitting: Orthopaedic Surgery

## 2019-10-07 ENCOUNTER — Other Ambulatory Visit: Payer: Self-pay

## 2019-10-07 VITALS — BP 146/82 | HR 77 | Ht 75.0 in | Wt 308.4 lb

## 2019-10-07 DIAGNOSIS — G8929 Other chronic pain: Secondary | ICD-10-CM | POA: Diagnosis not present

## 2019-10-07 DIAGNOSIS — M25561 Pain in right knee: Secondary | ICD-10-CM

## 2019-10-07 NOTE — Progress Notes (Signed)
Subjective:    Patient ID: Harold West, male    DOB: 1950/05/01, 69 y.o.   MRN: [id number]  HPI He has had pain of the right knee getting worse over the last three months.  He had arthroscopy of the knee about 35 years ago and has done well until this year.  He has swelling of the knee, popping and more recently giving way.  He has more medial pain.  He was seen  At Dr. Lanice Shirts office on 09-02-2019.  He had x-rays which showed: IMPRESSION: Mild degenerative joint disease. No acute abnormality seen in the right knee.  I have reviewed the notes.  I have independently reviewed and interpreted x-rays of this patient done at another site by another physician or qualified health professional.  He was given prednisone which helped a while but the pain has returned and he has the giving way.  I am concerned about meniscus tear.  I would like to get MRI.    Review of Systems  Constitutional: Positive for activity change.  Musculoskeletal: Positive for arthralgias, gait problem and joint swelling.  All other systems reviewed and are negative.  For Review of Systems, all other systems reviewed and are negative.  The following is a summary of the past history medically, past history surgically, known current medicines, social history and family history.  This information is gathered electronically by the computer from prior information and documentation.  I review this each visit and have found including this information at this point in the chart is beneficial and informative.   Past Medical History:  Diagnosis Date   Obesity (BMI 30-39.9) 02/24/2019   Testicular failure 02/24/2019   Vitamin D deficiency disease 02/24/2019    Past Surgical History:  Procedure Laterality Date   COLONOSCOPY N/A 03/10/2018   Procedure: COLONOSCOPY;  Surgeon: Daneil Dolin, MD;  [facility];  Service: Endoscopy;  Laterality: N/A;  7:30   KNEE SURGERY Right 1987   POLYPECTOMY   03/10/2018   Procedure: POLYPECTOMY;  Surgeon: Daneil Dolin, MD;  [facility]: [facility];  Service: Endoscopy;;  (colon)   Testicle removed Bilateral    VASECTOMY  1989    Current Outpatient Medications on File Prior to Visit  Medication Sig Dispense Refill   Ascorbic Acid (VITAMIN C PO) Take 1 tablet by mouth daily.     aspirin EC 81 MG tablet Take 81 mg by mouth daily.     Calcium Polycarbophil (FIBER-CAPS PO) Take 5 capsules by mouth daily.     Cholecalciferol (VITAMIN D3) 125 MCG (5000 UT) TABS Take 1 tablet by mouth daily.      Glucosamine HCl (GLUCOSAMINE PO) Take 1 tablet by mouth daily.     predniSONE (DELTASONE) 20 MG tablet Take 2 tablets (40 mg total) by mouth daily with breakfast. 10 tablet 0   testosterone cypionate (DEPOTESTOSTERONE CYPIONATE) 200 MG/ML injection INJECT 0.5 ML INTO THE MUSCLE TWICE A WEEK 10 mL 0   No current facility-administered medications on file prior to visit.    Social History   Socioeconomic History   Marital status: Married    Spouse name: Not on file   Number of children: Not on file   Years of education: Not on file   Highest education level: Not on file  Occupational History   Not on file  Tobacco Use   Smoking status: Never Smoker   Smokeless tobacco: Never Used  Vaping Use   Vaping Use: Never used  Substance  and Sexual Activity   Alcohol use: Yes    Comment: Occasional   Drug use: Never   Sexual activity: Not on file  Other Topics Concern   Not on file  Social History Narrative   Married for 43 years.Lives with wife.Retired August 2020.   Social Determinants of Health   Financial Resource Strain:    Difficulty of Paying Living Expenses:   Food Insecurity:    Worried About Charity fundraiser in the Last Year:    Arboriculturist in the Last Year:   Transportation Needs:    Film/video editor (Medical):    Lack of Transportation (Non-Medical):   Physical Activity:    Days of Exercise  per Week:    Minutes of Exercise per Session:   Stress:    Feeling of Stress :   Social Connections:    Frequency of Communication with Friends and Family:    Frequency of Social Gatherings with Friends and Family:    Attends Religious Services:    Active Member of Clubs or Organizations:    Attends Music therapist:    Marital Status:   Intimate Partner Violence:    Fear of Current or Ex-Partner:    Emotionally Abused:    Physically Abused:    Sexually Abused:     Family History  Problem Relation Age of Onset   Hypertension Mother    Diabetes Mother    Heart disease Father     BP (!) 146/82    Pulse 77    Ht 6\' 3"  (1.905 m)    Wt (!) 308 lb 6 oz (139.9 kg)    BMI 38.54 kg/m   Body mass index is 38.54 kg/m.     Objective:   Physical Exam Vitals and nursing note reviewed.  Constitutional:      Appearance: He is well-developed.  HENT:     Head: Normocephalic and atraumatic.  Eyes:     Conjunctiva/sclera: Conjunctivae normal.     Pupils: Pupils are equal, round, and reactive to light.  Cardiovascular:     Rate and Rhythm: Normal rate and regular rhythm.  Pulmonary:     Effort: Pulmonary effort is normal.  Abdominal:     Palpations: Abdomen is soft.  Musculoskeletal:     Cervical back: Normal range of motion and neck supple.       Legs:  Skin:    General: Skin is warm and dry.  Neurological:     Mental Status: He is alert and oriented to person, place, and time.     Cranial Nerves: No cranial nerve deficit.     Motor: No abnormal muscle tone.     Coordination: Coordination normal.     Deep Tendon Reflexes: Reflexes are normal and symmetric. Reflexes normal.  Psychiatric:        Behavior: Behavior normal.        Thought Content: Thought content normal.        Judgment: Judgment normal.           Assessment & Plan:   Encounter Diagnosis  Name Primary?   Chronic pain of right knee Yes   I will get MRI.  PROCEDURE  NOTE:  The patient requests injections of the right knee , verbal consent was obtained.  The right knee was prepped appropriately after time out was performed.   Sterile technique was observed and injection of 1 cc of Depo-Medrol 40 mg with several cc's of plain xylocaine. Anesthesia  was provided by ethyl chloride and a 20-gauge needle was used to inject the knee area. The injection was tolerated well.  A band aid dressing was applied.  The patient was advised to apply ice later today and tomorrow to the injection sight as needed.  Begin Aleve one bid pc  Return in two weeks.  Call if any problem.  Precautions discussed.   Electronically Signed Sanjuana Kava, MD 7/1/20218:32 AM

## 2019-10-19 ENCOUNTER — Telehealth (INDEPENDENT_AMBULATORY_CARE_PROVIDER_SITE_OTHER): Payer: Self-pay | Admitting: Internal Medicine

## 2019-10-19 ENCOUNTER — Other Ambulatory Visit (INDEPENDENT_AMBULATORY_CARE_PROVIDER_SITE_OTHER): Payer: Self-pay | Admitting: Internal Medicine

## 2019-10-19 MED ORDER — TESTOSTERONE CYPIONATE 200 MG/ML IM SOLN: INTRAMUSCULAR | 1 refills | Status: DC

## 2019-10-19 NOTE — Telephone Encounter (Signed)
Done

## 2019-10-29 ENCOUNTER — Other Ambulatory Visit: Payer: Self-pay

## 2019-10-29 ENCOUNTER — Ambulatory Visit (HOSPITAL_COMMUNITY)
Admission: RE | Admit: 2019-10-29 | Discharge: 2019-10-29 | Disposition: A | Discharge status: Home / Self Care | Payer: Medicare HMO | Source: Ambulatory Visit | Attending: Orthopaedic Surgery | Admitting: Orthopaedic Surgery

## 2019-10-29 DIAGNOSIS — G8929 Other chronic pain: Secondary | ICD-10-CM

## 2019-10-29 DIAGNOSIS — M25561 Pain in right knee: Secondary | ICD-10-CM | POA: Insufficient documentation

## 2019-11-01 ENCOUNTER — Encounter (INDEPENDENT_AMBULATORY_CARE_PROVIDER_SITE_OTHER): Payer: Medicare HMO | Admitting: Internal Medicine

## 2019-11-03 ENCOUNTER — Ambulatory Visit (HOSPITAL_COMMUNITY): Payer: Medicare HMO

## 2019-11-09 ENCOUNTER — Encounter: Payer: Self-pay | Admitting: Orthopaedic Surgery

## 2019-11-09 ENCOUNTER — Other Ambulatory Visit: Payer: Self-pay

## 2019-11-09 ENCOUNTER — Ambulatory Visit: Payer: Medicare HMO | Admitting: Orthopaedic Surgery

## 2019-11-09 VITALS — BP 149/85 | HR 77 | Ht 75.0 in | Wt 308.0 lb

## 2019-11-09 DIAGNOSIS — G8929 Other chronic pain: Secondary | ICD-10-CM

## 2019-11-09 DIAGNOSIS — M25561 Pain in right knee: Secondary | ICD-10-CM

## 2019-11-09 NOTE — Progress Notes (Signed)
Patient Harold West, male DOB:09-Nov-1950, 69 y.o. NTI:[id number]  Chief Complaint  Patient presents with  . Results    review MRI knee right  . Knee Pain    right/ feels better after injection     HPI  Camila Norville is a 69 y.o. male who has right knee pain, swelling and giving way.  He had MRI which showed: IMPRESSION: 1. Complex tear of the posterior horn of the medial meniscus. Small radial tear of the free edge of the body of the medial meniscus. 2. Partial radial tear of the root of the posterior horn of the lateral meniscus. 3. Tricompartmental cartilage abnormalities as described above. 4. Tear mild tendinosis of the patellar tendon origin.  I have independently reviewed the MRI.     I have explained the findings to him.  I have recommended arthroscopy of the knee.  I have explained the surgery to him.  I will have him see Dr. Aline Brochure.  He is agreeable.   Body mass index is 38.5 kg/m.  ROS  Review of Systems  Constitutional: Positive for activity change.  Musculoskeletal: Positive for arthralgias, gait problem and joint swelling.  All other systems reviewed and are negative.   All other systems reviewed and are negative.  The following is a summary of the past history medically, past history surgically, known current medicines, social history and family history.  This information is gathered electronically by the computer from prior information and documentation.  I review this each visit and have found including this information at this point in the chart is beneficial and informative.    Past Medical History:  Diagnosis Date  . Obesity (BMI 30-39.9) 02/24/2019  . Testicular failure 02/24/2019  . Vitamin D deficiency disease 02/24/2019    Past Surgical History:  Procedure Laterality Date  . COLONOSCOPY N/A 03/10/2018   Procedure: COLONOSCOPY;  Surgeon: Daneil Dolin, MD;  [facility];  Service: Endoscopy;  Laterality: N/A;  7:30  . KNEE  SURGERY Right 1987  . POLYPECTOMY  03/10/2018   Procedure: POLYPECTOMY;  Surgeon: Daneil Dolin, MD;  [facility];  Service: Endoscopy;;  (colon)  . Testicle removed Bilateral   . VASECTOMY  1989    Family History  Problem Relation Age of Onset  . Hypertension Mother   . Diabetes Mother   . Heart disease Father     Social History Social History   Tobacco Use  . Smoking status: Never Smoker  . Smokeless tobacco: Never Used  Vaping Use  . Vaping Use: Never used  Substance Use Topics  . Alcohol use: Yes    Comment: Occasional  . Drug use: Never    Allergies  Allergen Reactions  . Amiodarone Rash  . Penicillins Rash    Has patient had a PCN reaction causing immediate rash, facial/tongue/throat swelling, SOB or lightheadedness with hypotension: Unknown Has patient had a PCN reaction causing severe rash involving mucus membranes or skin necrosis: No Has patient had a PCN reaction that required hospitalization: No Has patient had a PCN reaction occurring within the last 10 years: No If all of the above answers are "NO", then may proceed with Cephalosporin use.     Current Outpatient Medications  Medication Sig Dispense Refill  . Ascorbic Acid (VITAMIN C PO) Take 1 tablet by mouth daily.    Marland Kitchen aspirin EC 81 MG tablet Take 81 mg by mouth daily.    . Calcium Polycarbophil (FIBER-CAPS PO) Take 5 capsules by mouth  daily.    . Cholecalciferol (VITAMIN D3) 125 MCG (5000 UT) TABS Take 1 tablet by mouth daily.     . Glucosamine HCl (GLUCOSAMINE PO) Take 1 tablet by mouth daily.    Marland Kitchen testosterone cypionate (DEPOTESTOSTERONE CYPIONATE) 200 MG/ML injection INJECT 0.5 ML INTO THE MUSCLE TWICE A WEEK 10 mL 1   No current facility-administered medications for this visit.     Physical Exam  Blood pressure (!) 149/85, pulse 77, height 6\' 3"  (1.905 m), weight (!) 308 lb (139.7 kg).  Constitutional: overall normal hygiene, normal nutrition, well developed, normal grooming,  normal body habitus. Assistive device:none  Musculoskeletal: gait and station Limp Right, muscle tone and strength are normal, no tremors or atrophy is present.  .  Neurological: coordination overall normal.  Deep tendon reflex/nerve stretch intact.  Sensation normal.  Cranial nerves II-XII intact.   Skin:   Normal overall no scars, lesions, ulcers or rashes. No psoriasis.  Psychiatric: Alert and oriented x 3.  Recent memory intact, remote memory unclear.  Normal mood and affect. Well groomed.  Good eye contact.  Cardiovascular: overall no swelling, no varicosities, no edema bilaterally, normal temperatures of the legs and arms, no clubbing, cyanosis and good capillary refill.  Lymphatic: palpation is normal.  Right knee with effusion, pain, crepitus, medial pain, positive medial McMurray, NV intact.  Limp right.   All other systems reviewed and are negative   The patient has been educated about the nature of the problem(s) and counseled on treatment options.  The patient appeared to understand what I have discussed and is in agreement with it.  Encounter Diagnosis  Name Primary?  . Chronic pain of right knee Yes    PLAN Call if any problems.  Precautions discussed.  Continue current medications.   Return to clinic to see Dr. Aline Brochure   Electronically Signed Sanjuana Kava, MD 8/3/20218:27 AM

## 2019-11-15 ENCOUNTER — Ambulatory Visit (HOSPITAL_COMMUNITY): Payer: Medicare HMO

## 2019-11-30 ENCOUNTER — Encounter (INDEPENDENT_AMBULATORY_CARE_PROVIDER_SITE_OTHER): Payer: Self-pay | Admitting: Internal Medicine

## 2019-11-30 ENCOUNTER — Other Ambulatory Visit: Payer: Self-pay

## 2019-11-30 ENCOUNTER — Ambulatory Visit (INDEPENDENT_AMBULATORY_CARE_PROVIDER_SITE_OTHER): Payer: Medicare HMO | Admitting: Internal Medicine

## 2019-11-30 VITALS — BP 136/82 | HR 89 | Temp 97.5°F | Resp 18 | Ht 75.0 in | Wt 299.8 lb

## 2019-11-30 DIAGNOSIS — E559 Vitamin D deficiency, unspecified: Secondary | ICD-10-CM | POA: Diagnosis not present

## 2019-11-30 DIAGNOSIS — M79672 Pain in left foot: Secondary | ICD-10-CM | POA: Diagnosis not present

## 2019-11-30 DIAGNOSIS — Z0001 Encounter for general adult medical examination with abnormal findings: Secondary | ICD-10-CM | POA: Diagnosis not present

## 2019-11-30 DIAGNOSIS — Z125 Encounter for screening for malignant neoplasm of prostate: Secondary | ICD-10-CM

## 2019-11-30 DIAGNOSIS — E291 Testicular hypofunction: Secondary | ICD-10-CM

## 2019-11-30 DIAGNOSIS — Z131 Encounter for screening for diabetes mellitus: Secondary | ICD-10-CM

## 2019-11-30 DIAGNOSIS — R0600 Dyspnea, unspecified: Secondary | ICD-10-CM | POA: Diagnosis not present

## 2019-11-30 DIAGNOSIS — E669 Obesity, unspecified: Secondary | ICD-10-CM

## 2019-11-30 NOTE — Progress Notes (Signed)
Chief Complaint: This delightful 69 year old man comes in for an annual physical exam and to address his chronic conditions which are described below. HPI: He has a history of hypogonadism and is on testosterone therapy twice a week.  He feels that his libido is still not as it should be. His wife is concerned about low mood and possible depression.  The patient himself feels that he was more depressed 4 to 5 months ago but seems to be improved now. He has vitamin D deficiency and continues to take vitamin D3 supplementation. Today he told me about episodes he has had in the last 4 months of sudden onset of dyspnea.  The dyspnea can last for a few minutes and at one time it did last for an hour.  On average she has had one episode per month in the last 4 months.  He has never felt it was bad enough to go to the emergency room.  Past Medical History:  Diagnosis Date  . Obesity (BMI 30-39.9) 02/24/2019  . Testicular failure 02/24/2019  . Vitamin D deficiency disease 02/24/2019   Past Surgical History:  Procedure Laterality Date  . COLONOSCOPY N/A 03/10/2018   Procedure: COLONOSCOPY;  Surgeon: Daneil Dolin, MD;  [facility];  Service: Endoscopy;  Laterality: N/A;  7:30  . KNEE SURGERY Right 1987  . POLYPECTOMY  03/10/2018   Procedure: POLYPECTOMY;  Surgeon: Daneil Dolin, MD;  [facility];  Service: Endoscopy;;  (colon)  . Testicle removed Bilateral   . VASECTOMY  1989     Social History   Social History Narrative   Married for 44 years.Lives with wife.Retired August 2020.    Social History   Tobacco Use  . Smoking status: Never Smoker  . Smokeless tobacco: Never Used  Substance Use Topics  . Alcohol use: Yes    Comment: Occasional      Allergies:  Allergies  Allergen Reactions  . Amiodarone Rash  . Penicillins Rash    Has patient had a PCN reaction causing immediate rash, facial/tongue/throat swelling, SOB or lightheadedness with  hypotension: Unknown Has patient had a PCN reaction causing severe rash involving mucus membranes or skin necrosis: No Has patient had a PCN reaction that required hospitalization: No Has patient had a PCN reaction occurring within the last 10 years: No If all of the above answers are "NO", then may proceed with Cephalosporin use.      Current Meds  Medication Sig  . Ascorbic Acid (VITAMIN C PO) Take 1 tablet by mouth daily.  Marland Kitchen aspirin EC 81 MG tablet Take 81 mg by mouth daily.  . Calcium Polycarbophil (FIBER-CAPS PO) Take 5 capsules by mouth daily.  . Cholecalciferol (VITAMIN D3) 125 MCG (5000 UT) TABS Take 1 tablet by mouth daily.   . Glucosamine HCl (GLUCOSAMINE PO) Take 1 tablet by mouth daily.  Marland Kitchen testosterone cypionate (DEPOTESTOSTERONE CYPIONATE) 200 MG/ML injection INJECT 0.5 ML INTO THE MUSCLE TWICE A WEEK      Depression screen [facility] 2/9 09/02/2019  Decreased Interest 0  Down, Depressed, Hopeless 0  PHQ - 2 Score 0     XBL:[id number] from the symptoms mentioned above,there are no other symptoms referable to all systems reviewed.       Physical Exam: Blood pressure 136/82, pulse 89, temperature (!) 97.5 F (36.4 C), temperature source Temporal, resp. rate 18, height 6\' 3"  (1.905 m), weight 299 lb 12.8 oz (136 kg), SpO2 98 %. Vitals with BMI 11/30/2019 11/09/2019  10/07/2019  Height 6\' 3"  6\' 3"  6\' 3"   Weight 299 lbs 13 oz 308 lbs 308 lbs 6 oz  BMI 37.47 70.3 50.09  Systolic 381 829 937  Diastolic 82 85 82  Pulse 89 77 77      He looks systemically well and remains obese but he has lost about 8 to 9 pounds in weight since the last time I saw him.  Blood pressure is acceptable. General: Alert, cooperative, and appears to be the stated age.No pallor.  No jaundice.  No clubbing. Head: Normocephalic Eyes: Sclera white, pupils equal and reactive to light, red reflex x 2,  Ears: Normal bilaterally Oral cavity: Lips, mucosa, and tongue normal: Teeth and gums normal Neck: No  adenopathy, supple, symmetrical, trachea midline, and thyroid does not appear enlarged Respiratory: Clear to auscultation bilaterally.No wheezing, crackles or bronchial breathing. Cardiovascular: Heart sounds are present and appear to be normal without murmurs or added sounds.  No carotid bruits.  Peripheral pulses are present and equal bilaterally.: Gastrointestinal:positive bowel sounds, no hepatosplenomegaly.  No masses felt.No tenderness. Skin: Clear, No rashes noted.No worrisome skin lesions seen.  Examination of his feet shows cracked skin on the left plantar aspect. Neurological: Grossly intact without focal findings, cranial nerves II through XII intact, muscle strength equal bilaterally Musculoskeletal: No acute joint abnormalities noted.Full range of movement noted with joints.  Deformity of both feet more on the left than the right. Psychiatric: Affect appropriate, non-anxious.    Assessment  1. Encounter for general adult medical examination with abnormal findings   2. Testicular failure   3. Obesity (BMI 30-39.9)   4. Vitamin D deficiency disease   5. Dyspnea, unspecified type   6. Screening for diabetes mellitus   7. Special screening for malignant neoplasm of prostate   8. Left foot pain     Tests Ordered:   Orders Placed This Encounter  Procedures  . CBC  . COMPLETE METABOLIC PANEL WITH GFR  . Hemoglobin A1c  . Lipid panel  . PSA, Total with Reflex to PSA, Free  . T3, free  . T4  . TSH  . Testosterone Total,Free,Bio, Males  . VITAMIN D 25 Hydroxy (Vit-D Deficiency, Fractures)  . D-dimer, Quantitative  . Ambulatory referral to [place]  1. He will continue with testosterone therapy and we will check levels today.  If they are not adequately elevated enough I did mention the possibility of using testosterone cream which might actually help him on a daily basis and be more consistent with levels. 2. I will refer him to podiatry for his foot issues  especially the left side. 3. I am somewhat concerned about the dyspnea and although he is severely deconditioned, I want to make sure he has no evidence of thromboembolic disease so I will check a D-dimer. 4. We did discuss the importance of movement.  His wife tells me that he does not really exercise and throughout the day does not move much at all.  I recommended that he move every 30 minutes and set a timer on his phone to remind him. 5. We discussed the blue zones again and more of a plant-based diet.  Currently he is on a ketogenic diet and he seems to be successful losing weight and I have emphasized the plant-based diet of this also. 6. Further recommendations will depend on blood results and I will see him in about 3 to 4 months time for follow-up. 7. Today, in addition to  a preventative visit, I performed an office visit to address his symptoms and conditions above.     No orders of the defined types were placed in this encounter.    Alfreda Hammad Luther Parody   11/30/2019, 4:33 PM

## 2019-12-01 LAB — T4: T4, Total: 7.7 ug/dL (ref 4.9–10.5)

## 2019-12-01 LAB — COMPLETE METABOLIC PANEL WITH GFR
AG Ratio: 2 (calc) (ref 1.0–2.5)
ALT: 17 U/L (ref 9–46)
AST: 20 U/L (ref 10–35)
Albumin: 4.5 g/dL (ref 3.6–5.1)
Alkaline phosphatase (APISO): 43 U/L (ref 35–144)
BUN: 15 mg/dL (ref 7–25)
CO2: 30 mmol/L (ref 20–32)
Calcium: 10 mg/dL (ref 8.6–10.3)
Chloride: 101 mmol/L (ref 98–110)
Creat: 1.03 mg/dL (ref 0.70–1.25)
GFR, Est African American: 87 mL/min/{1.73_m2} (ref >=60)
GFR, Est Non African American: 75 mL/min/{1.73_m2} (ref >=60)
Globulin: 2.2 g/dL (calc) (ref 1.9–3.7)
Glucose, Bld: 84 mg/dL (ref 65–99)
Potassium: 4.2 mmol/L (ref 3.5–5.3)
Sodium: 140 mmol/L (ref 135–146)
Total Bilirubin: 0.8 mg/dL (ref 0.2–1.2)
Total Protein: 6.7 g/dL (ref 6.1–8.1)

## 2019-12-01 LAB — LIPID PANEL
Cholesterol: 122 mg/dL (ref <200)
HDL: 30 mg/dL — ABNORMAL LOW (ref >=40)
LDL Cholesterol (Calc): 72 mg/dL (calc)
Non-HDL Cholesterol (Calc): 92 mg/dL (calc) (ref <130)
Total CHOL/HDL Ratio: 4.1 (calc) (ref <5.0)
Triglycerides: 122 mg/dL (ref <150)

## 2019-12-01 LAB — TESTOSTERONE TOTAL,FREE,BIO, MALES
Albumin: 4.5 g/dL (ref 3.6–5.1)
Sex Hormone Binding: 28 nmol/L (ref 22–77)
Testosterone, Bioavailable: 451.7 ng/dL (ref >=110.0)
Testosterone, Free: 219.7 pg/mL (ref 46.0–224.0)
Testosterone: 1150 ng/dL — ABNORMAL HIGH (ref 250–827)

## 2019-12-01 LAB — CBC
HCT: 52.3 % — ABNORMAL HIGH (ref 38.5–50.0)
Hemoglobin: 16.8 g/dL (ref 13.2–17.1)
MCH: 27.2 pg (ref 27.0–33.0)
MCHC: 32.1 g/dL (ref 32.0–36.0)
MCV: 84.8 fL (ref 80.0–100.0)
MPV: 12.4 fL (ref 7.5–12.5)
Platelets: 219 10*3/uL (ref 140–400)
RBC: 6.17 10*6/uL — ABNORMAL HIGH (ref 4.20–5.80)
RDW: 13.1 % (ref 11.0–15.0)
WBC: 8.9 10*3/uL (ref 3.8–10.8)

## 2019-12-01 LAB — VITAMIN D 25 HYDROXY (VIT D DEFICIENCY, FRACTURES): Vit D, 25-Hydroxy: 58 ng/mL (ref 30–100)

## 2019-12-01 LAB — HEMOGLOBIN A1C
Hgb A1c MFr Bld: 5.4 % of total Hgb (ref <5.7)
Mean Plasma Glucose: 108 (calc)
eAG (mmol/L): 6 (calc)

## 2019-12-01 LAB — T3, FREE: T3, Free: 3 pg/mL (ref 2.3–4.2)

## 2019-12-01 LAB — D-DIMER, QUANTITATIVE: D-Dimer, Quant: 0.43 mcg/mL FEU (ref <0.50)

## 2019-12-01 LAB — PSA, TOTAL WITH REFLEX TO PSA, FREE: PSA, Total: 2.4 ng/mL (ref <=4.0)

## 2019-12-01 LAB — TSH: TSH: 1.32 mIU/L (ref 0.40–4.50)

## 2019-12-07 ENCOUNTER — Other Ambulatory Visit: Payer: Self-pay

## 2019-12-07 ENCOUNTER — Ambulatory Visit (INDEPENDENT_AMBULATORY_CARE_PROVIDER_SITE_OTHER): Payer: Medicare HMO | Admitting: Orthopedic Surgery

## 2019-12-07 VITALS — BP 155/94 | HR 94 | Ht 75.0 in | Wt 295.0 lb

## 2019-12-07 DIAGNOSIS — M23321 Other meniscus derangements, posterior horn of medial meniscus, right knee: Secondary | ICD-10-CM | POA: Diagnosis not present

## 2019-12-07 DIAGNOSIS — I4891 Unspecified atrial fibrillation: Secondary | ICD-10-CM | POA: Insufficient documentation

## 2019-12-07 NOTE — Progress Notes (Signed)
NEW PROBLEM//OFFICE VISIT  Chief Complaint  Patient presents with  . Knee Pain    right / several months had injection with some relief with cortizone injection / surgical evaluation     69 year old male previously treated for chronic right knee pain with exacerbation unresponsive to cortisone injection.  Patient eventually had MRI which showed a torn medial meniscus and full to partial thickness cartilage loss in the medial compartment and patellofemoral joint  Patient comes in to discuss surgical options.   Review of Systems  Constitutional: Negative for chills and fever.  Musculoskeletal: Positive for joint pain.     Past Medical History:  Diagnosis Date  . Obesity (BMI 30-39.9) 02/24/2019  . Testicular failure 02/24/2019  . Vitamin D deficiency disease 02/24/2019    Past Surgical History:  Procedure Laterality Date  . COLONOSCOPY N/A 03/10/2018   Procedure: COLONOSCOPY;  Surgeon: Daneil Dolin, MD;  [facility];  Service: Endoscopy;  Laterality: N/A;  7:30  . KNEE SURGERY Right 1987  . POLYPECTOMY  03/10/2018   Procedure: POLYPECTOMY;  Surgeon: Daneil Dolin, MD;  [facility];  Service: Endoscopy;;  (colon)  . Testicle removed Bilateral   . VASECTOMY  1989    Family History  Problem Relation Age of Onset  . Hypertension Mother   . Diabetes Mother   . Heart disease Father    Social History   Tobacco Use  . Smoking status: Never Smoker  . Smokeless tobacco: Never Used  Vaping Use  . Vaping Use: Never used  Substance Use Topics  . Alcohol use: Yes    Comment: Occasional  . Drug use: Never    Allergies  Allergen Reactions  . Amiodarone Rash  . Penicillins Rash    Has patient had a PCN reaction causing immediate rash, facial/tongue/throat swelling, SOB or lightheadedness with hypotension: Unknown Has patient had a PCN reaction causing severe rash involving mucus membranes or skin necrosis: No Has patient had a PCN reaction that  required hospitalization: No Has patient had a PCN reaction occurring within the last 10 years: No If all of the above answers are "NO", then may proceed with Cephalosporin use.     Current Meds  Medication Sig  . Ascorbic Acid (VITAMIN C PO) Take 1 tablet by mouth daily.  Marland Kitchen aspirin EC 81 MG tablet Take 81 mg by mouth daily.  . Calcium Polycarbophil (FIBER-CAPS PO) Take 5 capsules by mouth daily.  . Cholecalciferol (VITAMIN D3) 125 MCG (5000 UT) TABS Take 1 tablet by mouth daily.   . Glucosamine HCl (GLUCOSAMINE PO) Take 1 tablet by mouth daily.  Marland Kitchen testosterone cypionate (DEPOTESTOSTERONE CYPIONATE) 200 MG/ML injection INJECT 0.5 ML INTO THE MUSCLE TWICE A WEEK    BP (!) 155/94   Pulse 94   Ht 6\' 3"  (1.905 m)   Wt 295 lb (133.8 kg)   BMI 36.87 kg/m   Physical Exam Constitutional:      General: He is not in acute distress.    Appearance: He is well-developed.  Eyes:     General: No scleral icterus.       Right eye: No discharge.        Left eye: No discharge.     Pupils: Pupils are equal, round, and reactive to light.  Cardiovascular:     Comments: No peripheral edema Musculoskeletal:     Right knee: No effusion.     Instability Tests: Medial McMurray test positive.  Skin:    General:  Skin is warm and dry.  Neurological:     Mental Status: He is alert and oriented to person, place, and time.     Sensory: No sensory deficit.     Coordination: Coordination normal.     Gait: Gait normal.     Deep Tendon Reflexes: Reflexes are normal and symmetric.  Psychiatric:        Mood and Affect: Mood normal.        Behavior: Behavior normal.        Thought Content: Thought content normal.        Judgment: Judgment normal.     Right Knee Exam   Muscle Strength  The patient has normal right knee strength.  Tenderness  The patient is experiencing tenderness in the medial joint line and patella.  Range of Motion  Extension: normal  Right knee flexion: 125.   Tests   McMurray:  Medial - positive  Varus: negative Valgus: negative Lachman:  Anterior - negative     Drawer:  Anterior - negative    Posterior - negative  Other  Erythema: absent Scars: absent Sensation: normal Pulse: present Swelling: none Effusion: no effusion present   Left Knee Exam  Left knee exam is normal.        MEDICAL DECISION MAKING  A.  Encounter Diagnosis  Name Primary?  . Derangement of posterior horn of medial meniscus of right knee Yes    B. DATA ANALYSED:   IMAGING: Interpretation of images: Plain films 3 degree valgus alignment, MRI shows torn medial meniscus and cartilage loss medial femoral condyle and patella  Orders: Surgery arthroscopy right knee partial medial meniscectomy  Outside records reviewed: None   C. MANAGEMENT   The procedure has been fully reviewed with the patient; The risks and benefits of surgery have been discussed and explained and understood. Alternative treatment has also been reviewed, questions were encouraged and answered. The postoperative plan is also been reviewed.  Discussed possible knee replacement versus arthroscopic surgery.  Based on symptoms plain films alignment overall function recommended arthroscopic surgery possibility of knee replacement in 3 to 5 years discussed weight loss as well  No orders of the defined types were placed in this encounter.     Arther Abbott, MD  12/07/2019 2:44 PM

## 2019-12-07 NOTE — Patient Instructions (Signed)
Meniscus Injury, Arthroscopy   Arthroscopy is a surgical procedure that involves the use of a small scope that has a camera and surgical instruments on the end (arthroscope). An arthroscope can be used to repair your meniscus injury.  LET YOUR HEALTH CARE PROVIDER KNOW ABOUT:  Any allergies you have.  All medicines you are taking, including vitamins, herbs, eyedrops, creams, and over-the-counter medicines.  Any recent colds or infections you have had or currently have.  Previous problems you or members of your family have had with the use of anesthetics.  Any blood disorders or blood clotting problems you have.  Previous surgeries you have had.  Medical conditions you have. RISKS AND COMPLICATIONS Generally, this is a safe procedure. However, as with any procedure, problems can occur. Possible problems include:  Damage to nerves or blood vessels.  Excess bleeding.  Blood clots.  Infection. BEFORE THE PROCEDURE  Do not eat or drink for 6-8 hours before the procedure.  Take medicines as directed by your surgeon. Ask your surgeon about changing or stopping your regular medicines.  You may have lab tests the morning of surgery. PROCEDURE  You will be given one of the following:   A medicine that numbs the area (local anesthesia).  A medicine that makes you go to sleep (general anesthesia).  A medicine injected into your spine that numbs your body below the waist (spinal anesthesia). Most often, several small cuts (incisions) are made in the knee. The arthroscope and instruments go into the incisions to repair the damage. The torn portion of the meniscus is removed.   AFTER THE PROCEDURE  You will be taken to the recovery area where your progress will be monitored. When you are awake, stable, and taking fluids without complications, you will be allowed to go home. This is usually the same day. A torn or stretched ligament (ligament sprain) may take 6-8 weeks to heal.   It  takes about the 4-6 WEEKS if your surgeon removed a torn meniscus.  A repaired meniscus may require 6-12 weeks of recovery time.  A torn ligament needing reconstructive surgery may take 6-12 months to heal fully.   This information is not intended to replace advice given to you by your health care provider. Make sure you discuss any questions you have with your health care provider. You have decided to proceed with operative arthroscopy of the knee. You have decided not to continue with nonoperative measures such as but not limited to oral medication, weight loss, activity modification, physical therapy, bracing, or injection.  We will perform operative arthroscopy of the knee. Some of the risks associated with arthroscopic surgery of the knee include but are not limited to Bleeding Infection Swelling Stiffness Blood clot Pain Need for knee replacement surgery    In compliance with recent [place] law in federal regulation regarding opioid use and abuse and addiction, we will taper (stop) opioid medication after 2 weeks.  If you're not comfortable with these risks and would like to continue with nonoperative treatment please let Dr. Meara Wiechman know prior to your surgery. 

## 2019-12-08 DIAGNOSIS — R69 Illness, unspecified: Secondary | ICD-10-CM | POA: Diagnosis not present

## 2019-12-16 ENCOUNTER — Other Ambulatory Visit (INDEPENDENT_AMBULATORY_CARE_PROVIDER_SITE_OTHER): Payer: Self-pay

## 2019-12-16 MED ORDER — TESTOSTERONE CYPIONATE 200 MG/ML IM SOLN
INTRAMUSCULAR | 1 refills | Status: DC
Start: 2019-12-16 — End: 2020-06-14

## 2019-12-16 NOTE — Telephone Encounter (Signed)
Please update testosterone  dosage: you told him to take 2 injection a week now. Insurance needs a new Rx to allow with dose changes. Pt is out.

## 2019-12-17 DIAGNOSIS — H5203 Hypermetropia, bilateral: Secondary | ICD-10-CM | POA: Diagnosis not present

## 2019-12-22 ENCOUNTER — Other Ambulatory Visit: Payer: Self-pay

## 2019-12-22 ENCOUNTER — Ambulatory Visit: Payer: Medicare HMO | Admitting: Podiatry

## 2019-12-22 ENCOUNTER — Encounter: Payer: Self-pay | Admitting: Podiatry

## 2019-12-22 DIAGNOSIS — L84 Corns and callosities: Secondary | ICD-10-CM

## 2019-12-22 DIAGNOSIS — B351 Tinea unguium: Secondary | ICD-10-CM | POA: Diagnosis not present

## 2019-12-22 DIAGNOSIS — M79675 Pain in left toe(s): Secondary | ICD-10-CM | POA: Diagnosis not present

## 2019-12-22 DIAGNOSIS — M79674 Pain in right toe(s): Secondary | ICD-10-CM

## 2019-12-22 DIAGNOSIS — M21619 Bunion of unspecified foot: Secondary | ICD-10-CM

## 2019-12-23 NOTE — Progress Notes (Signed)
Subjective:   Patient ID: Harold West, male   DOB: 69 y.o.   MRN: [id number]   HPI Patient presents with a history of bunion surgery left and also has had a digital surgery right.  Has developed significant callus formation left that is painful along with the right and has nails that have gotten thickened and incurvated and he cannot cut.  He is concerned about the bunion but does not want x-rays and does not want surgery.  Patient does not smoke likes to be active   Review of Systems  All other systems reviewed and are negative.       Objective:  Physical Exam Vitals and nursing note reviewed.  Constitutional:      Appearance: He is well-developed.  Pulmonary:     Effort: Pulmonary effort is normal.  Musculoskeletal:        General: Normal range of motion.  Skin:    General: Skin is warm.  Neurological:     Mental Status: He is alert.     Neurovascular status intact muscle strength was found to be adequate range of motion within normal limits.  Patient is noted to have reoccurrence of bunion deformity left with deviation of the hallux against the second toe and prominence that is not tender currently but keratotic lesion on the first metatarsal head left and the hallux bilateral.  They do get painful when pressed.  Patient does have thick yellow brittle nailbeds 1-5 both feet that are incurvated in the corners and do get sore when he tries to wear shoe gear properly     Assessment:  Structural deformity with mycotic nail infection and lesion formation     Plan:  H&P did discuss surgery it could be done but it would probably require very extensive procedure he is not interested currently.  We are just can utilize conservative treatment and today I debrided nailbeds 1-5 both feet with no iatrogenic bleeding and lesions with no iatrogenic bleeding and recommended we continue this on a regular basis with consideration of surgery if symptoms were to get worse

## 2019-12-27 ENCOUNTER — Telehealth: Payer: Self-pay | Admitting: Orthopedic Surgery

## 2019-12-27 NOTE — Telephone Encounter (Signed)
Called him, mailed order for walker to use about a week after surgery.  Get at [employer].

## 2019-12-27 NOTE — Telephone Encounter (Signed)
Patient and his wife have some general questions regarding how long is he going to be down, how do they get crutches, etc.  Would you please call them?  Thanks

## 2020-01-17 ENCOUNTER — Telehealth: Payer: Self-pay | Admitting: Radiology

## 2020-01-17 NOTE — Patient Instructions (Signed)
Harold West  01/17/2020     @PREFPERIOPPHARMACY @   Your procedure is scheduled on  01/21/2020.  Report to [facility] at  [phone]  A.M.  Call this number if you have problems the morning of surgery:  [phone]   Remember:  Do not eat or drink after midnight.                         Take these medicines the morning of surgery with A SIP OF WATER  None    Do not wear jewelry, make-up or nail polish.  Do not wear lotions, powders, or perfumes. Please wear deodorant and brush your teeth.  Do not shave 48 hours prior to surgery.  Men may shave face and neck.  Do not bring valuables to the hospital.  [facility] is not responsible for any belongings or valuables.  Contacts, dentures or bridgework may not be worn into surgery.  Leave your suitcase in the car.  After surgery it may be brought to your room.  For patients admitted to the hospital, discharge time will be determined by your treatment team.  Patients discharged the day of surgery will not be allowed to drive home.   Name and phone number of your driver:   family Special instructions:   DO NOT smoke the morning of your procedure.  Please read over the following fact sheets that you were given. Anesthesia Post-op Instructions and Care and Recovery After Surgery       Arthroscopic Knee Ligament Repair, Care After This sheet gives you information about how to care for yourself after your procedure. Your health care provider may also give you more specific instructions. If you have problems or questions, contact your health care provider. What can I expect after the procedure? After the procedure, it is common to have:  Pain in your knee.  Bruising and swelling on your knee, calf, and ankle for 3-4 days.  Fatigue. Follow these instructions at home: If you have a brace or immobilizer:  Wear the brace or immobilizer as told by your health care provider. Remove it only as told by your health care  provider.  Loosen the splint or immobilizer if your toes tingle, become numb, or turn cold and blue.  Keep the brace or immobilizer clean. Bathing  Do not take baths, swim, or use a hot tub until your health care provider approves. Ask your health care provider if you can take showers.  Keep your bandage (dressing) dry until your health care provider says that it can be removed. Cover it and your brace or immobilizer with a watertight covering when you take a shower. Incision care   Follow instructions from your health care provider about how to take care of your incision. Make sure you: ? Wash your hands with soap and water before you change your bandage (dressing). If soap and water are not available, use hand sanitizer. ? Change your dressing as told by your health care provider. ? Leave stitches (sutures), skin glue, or adhesive strips in place. These skin closures may need to stay in place for 2 weeks or longer. If adhesive strip edges start to loosen and curl up, you may trim the loose edges. Do not remove adhesive strips completely unless your health care provider tells you to do that.  Check your incision area every day for signs of infection. Check for: ? More redness, swelling, or pain. ?  More fluid or blood. ? Warmth. ? Pus or a bad smell. Managing pain, stiffness, and swelling   If directed, put ice on the affected area. ? If you have a removable brace or immobilizer, remove it as told by your health care provider. ? Put ice in a plastic bag. ? Place a towel between your skin and the bag or between your brace or immobilizer and the bag. ? Leave the ice on for 20 minutes, 2-3 times a day.  Move your toes often to avoid stiffness and to lessen swelling.  Raise (elevate) the injured area above the level of your heart while you are sitting or lying down. Driving  Do not drive until your health care provider approves. If you have a brace or immobilizer on your leg, ask  your health care provider when it is safe for you to drive.  Do not drive or use heavy machinery while taking prescription pain medicine. Activity  Rest as directed. Ask your health care provider what activities are safe for you.  Do physical therapy exercises as told by your health care provider. Physical therapy will help you regain strength and motion in your knee.  Follow instructions from your health care provider about: ? When you may start motion exercises. ? When you may start riding a stationary bike and doing other low-impact activities. ? When you may start to jog and do other high-impact activities. Safety  Do not use the injured limb to support your body weight until your health care provider says that you can. Use crutches as told by your health care provider. General instructions  Do not use any products that contain nicotine or tobacco, such as cigarettes and e-cigarettes. These can delay bone healing. If you need help quitting, ask your health care provider.  To prevent or treat constipation while you are taking prescription pain medicine, your health care provider may recommend that you: ? Drink enough fluid to keep your urine clear or pale yellow. ? Take over-the-counter or prescription medicines. ? Eat foods that are high in fiber, such as fresh fruits and vegetables, whole grains, and beans. ? Limit foods that are high in fat and processed sugars, such as fried and sweet foods.  Take over-the-counter and prescription medicines only as told by your health care provider.  Keep all follow-up visits as told by your health care provider. This is important. Contact a health care provider if:  You have more redness, swelling, or pain around an incision.  You have more fluid or blood coming from an incision.  Your incision feels warm to the touch.  You have a fever.  You have pain or swelling in your knee, and it gets worse.  You have pain that does not get  better with medicine. Get help right away if:  You have trouble breathing.  You have pus or a bad smell coming from an incision.  You have numbness and tingling near the knee joint. Summary  After the procedure, it is common to have knee pain with bruising and swelling on your knee, calf, and ankle.  Icing your knee and raising your leg above the level of your heart will help control the pain and the swelling.  Do physical therapy exercises as told by your health care provider. Physical therapy will help you regain strength and motion in your knee. This information is not intended to replace advice given to you by your health care provider. Make sure you discuss any questions you  have with your health care provider. Document Revised: 03/07/2017 Document Reviewed: 03/19/2016 Elsevier Patient Education  2020 [place] Anesthesia, Adult, Care After This sheet gives you information about how to care for yourself after your procedure. Your health care provider may also give you more specific instructions. If you have problems or questions, contact your health care provider. What can I expect after the procedure? After the procedure, the following side effects are common:  Pain or discomfort at the IV site.  Nausea.  Vomiting.  Sore throat.  Trouble concentrating.  Feeling cold or chills.  Weak or tired.  Sleepiness and fatigue.  Soreness and body aches. These side effects can affect parts of the body that were not involved in surgery. Follow these instructions at home:  For at least 24 hours after the procedure:  Have a responsible adult stay with you. It is important to have someone help care for you until you are awake and alert.  Rest as needed.  Do not: ? Participate in activities in which you could fall or become injured. ? Drive. ? Use heavy machinery. ? Drink alcohol. ? Take sleeping pills or medicines that cause drowsiness. ? Make important  decisions or sign legal documents. ? Take care of children on your own. Eating and drinking  Follow any instructions from your health care provider about eating or drinking restrictions.  When you feel hungry, start by eating small amounts of foods that are soft and easy to digest (bland), such as toast. Gradually return to your regular diet.  Drink enough fluid to keep your urine pale yellow.  If you vomit, rehydrate by drinking water, juice, or clear broth. General instructions  If you have sleep apnea, surgery and certain medicines can increase your risk for breathing problems. Follow instructions from your health care provider about wearing your sleep device: ? Anytime you are sleeping, including during daytime naps. ? While taking prescription pain medicines, sleeping medicines, or medicines that make you drowsy.  Return to your normal activities as told by your health care provider. Ask your health care provider what activities are safe for you.  Take over-the-counter and prescription medicines only as told by your health care provider.  If you smoke, do not smoke without supervision.  Keep all follow-up visits as told by your health care provider. This is important. Contact a health care provider if:  You have nausea or vomiting that does not get better with medicine.  You cannot eat or drink without vomiting.  You have pain that does not get better with medicine.  You are unable to pass urine.  You develop a skin rash.  You have a fever.  You have redness around your IV site that gets worse. Get help right away if:  You have difficulty breathing.  You have chest pain.  You have blood in your urine or stool, or you vomit blood. Summary  After the procedure, it is common to have a sore throat or nausea. It is also common to feel tired.  Have a responsible adult stay with you for the first 24 hours after general anesthesia. It is important to have someone help  care for you until you are awake and alert.  When you feel hungry, start by eating small amounts of foods that are soft and easy to digest (bland), such as toast. Gradually return to your regular diet.  Drink enough fluid to keep your urine pale yellow.  Return to your normal activities as  told by your health care provider. Ask your health care provider what activities are safe for you. This information is not intended to replace advice given to you by your health care provider. Make sure you discuss any questions you have with your health care provider. Document Revised: 03/28/2017 Document Reviewed: 11/08/2016 Elsevier Patient Education  [place]. How to Use Chlorhexidine for Bathing Chlorhexidine gluconate (CHG) is a germ-killing (antiseptic) solution that is used to clean the skin. It can get rid of the bacteria that normally live on the skin and can keep them away for about 24 hours. To clean your skin with CHG, you may be given:  A CHG solution to use in the shower or as part of a sponge bath.  A prepackaged cloth that contains CHG. Cleaning your skin with CHG may help lower the risk for infection:  While you are staying in the intensive care unit of the hospital.  If you have a vascular access, such as a central line, to provide short-term or long-term access to your veins.  If you have a catheter to drain urine from your bladder.  If you are on a ventilator. A ventilator is a machine that helps you breathe by moving air in and out of your lungs.  After surgery. What are the risks? Risks of using CHG include:  A skin reaction.  Hearing loss, if CHG gets in your ears.  Eye injury, if CHG gets in your eyes and is not rinsed out.  The CHG product catching fire. Make sure that you avoid smoking and flames after applying CHG to your skin. Do not use CHG:  If you have a chlorhexidine allergy or have previously reacted to chlorhexidine.  On babies younger than 16  months of age. How to use CHG solution  Use CHG only as told by your health care provider, and follow the instructions on the label.  Use the full amount of CHG as directed. Usually, this is one bottle. During a shower Follow these steps when using CHG solution during a shower (unless your health care provider gives you different instructions): 1. Start the shower. 2. Use your normal soap and shampoo to wash your face and hair. 3. Turn off the shower or move out of the shower stream. 4. Pour the CHG onto a clean washcloth. Do not use any type of brush or rough-edged sponge. 5. Starting at your neck, lather your body down to your toes. Make sure you follow these instructions: ? If you will be having surgery, pay special attention to the part of your body where you will be having surgery. Scrub this area for at least 1 minute. ? Do not use CHG on your head or face. If the solution gets into your ears or eyes, rinse them well with water. ? Avoid your genital area. ? Avoid any areas of skin that have broken skin, cuts, or scrapes. ? Scrub your back and under your arms. Make sure to wash skin folds. 6. Let the lather sit on your skin for 1-2 minutes or as long as told by your health care provider. 7. Thoroughly rinse your entire body in the shower. Make sure that all body creases and crevices are rinsed well. 8. Dry off with a clean towel. Do not put any substances on your body afterward--such as powder, lotion, or perfume--unless you are told to do so by your health care provider. Only use lotions that are recommended by the manufacturer. 9. Put on clean  clothes or pajamas. 10. If it is the night before your surgery, sleep in clean sheets.  During a sponge bath Follow these steps when using CHG solution during a sponge bath (unless your health care provider gives you different instructions): 1. Use your normal soap and shampoo to wash your face and hair. 2. Pour the CHG onto a clean  washcloth. 3. Starting at your neck, lather your body down to your toes. Make sure you follow these instructions: ? If you will be having surgery, pay special attention to the part of your body where you will be having surgery. Scrub this area for at least 1 minute. ? Do not use CHG on your head or face. If the solution gets into your ears or eyes, rinse them well with water. ? Avoid your genital area. ? Avoid any areas of skin that have broken skin, cuts, or scrapes. ? Scrub your back and under your arms. Make sure to wash skin folds. 4. Let the lather sit on your skin for 1-2 minutes or as long as told by your health care provider. 5. Using a different clean, wet washcloth, thoroughly rinse your entire body. Make sure that all body creases and crevices are rinsed well. 6. Dry off with a clean towel. Do not put any substances on your body afterward--such as powder, lotion, or perfume--unless you are told to do so by your health care provider. Only use lotions that are recommended by the manufacturer. 7. Put on clean clothes or pajamas. 8. If it is the night before your surgery, sleep in clean sheets. How to use CHG prepackaged cloths  Only use CHG cloths as told by your health care provider, and follow the instructions on the label.  Use the CHG cloth on clean, dry skin.  Do not use the CHG cloth on your head or face unless your health care provider tells you to.  When washing with the CHG cloth: ? Avoid your genital area. ? Avoid any areas of skin that have broken skin, cuts, or scrapes. Before surgery Follow these steps when using a CHG cloth to clean before surgery (unless your health care provider gives you different instructions): 1. Using the CHG cloth, vigorously scrub the part of your body where you will be having surgery. Scrub using a back-and-forth motion for 3 minutes. The area on your body should be completely wet with CHG when you are done scrubbing. 2. Do not rinse. Discard  the cloth and let the area air-dry. Do not put any substances on the area afterward, such as powder, lotion, or perfume. 3. Put on clean clothes or pajamas. 4. If it is the night before your surgery, sleep in clean sheets.  For general bathing Follow these steps when using CHG cloths for general bathing (unless your health care provider gives you different instructions). 1. Use a separate CHG cloth for each area of your body. Make sure you wash between any folds of skin and between your fingers and toes. Wash your body in the following order, switching to a new cloth after each step: ? The front of your neck, shoulders, and chest. ? Both of your arms, under your arms, and your hands. ? Your stomach and groin area, avoiding the genitals. ? Your right leg and foot. ? Your left leg and foot. ? The back of your neck, your back, and your buttocks. 2. Do not rinse. Discard the cloth and let the area air-dry. Do not put any substances on your  body afterward--such as powder, lotion, or perfume--unless you are told to do so by your health care provider. Only use lotions that are recommended by the manufacturer. 3. Put on clean clothes or pajamas. Contact a health care provider if:  Your skin gets irritated after scrubbing.  You have questions about using your solution or cloth. Get help right away if:  Your eyes become very red or swollen.  Your eyes itch badly.  Your skin itches badly and is red or swollen.  Your hearing changes.  You have trouble seeing.  You have swelling or tingling in your mouth or throat.  You have trouble breathing.  You swallow any chlorhexidine. Summary  Chlorhexidine gluconate (CHG) is a germ-killing (antiseptic) solution that is used to clean the skin. Cleaning your skin with CHG may help to lower your risk for infection.  You may be given CHG to use for bathing. It may be in a bottle or in a prepackaged cloth to use on your skin. Carefully follow your  health care provider's instructions and the instructions on the product label.  Do not use CHG if you have a chlorhexidine allergy.  Contact your health care provider if your skin gets irritated after scrubbing. This information is not intended to replace advice given to you by your health care provider. Make sure you discuss any questions you have with your health care provider. Document Revised: 06/11/2018 Document Reviewed: 02/20/2017 Elsevier Patient Education  [place].

## 2020-01-17 NOTE — Telephone Encounter (Signed)
[place] medicare does not require authorization for the outpatient knee arthroscopy since hospital and doctor are in network.

## 2020-01-19 ENCOUNTER — Other Ambulatory Visit: Payer: Self-pay

## 2020-01-19 ENCOUNTER — Other Ambulatory Visit (HOSPITAL_COMMUNITY)
Admission: RE | Admit: 2020-01-19 | Discharge: 2020-01-19 | Disposition: A | Discharge status: Home / Self Care | Payer: Medicare HMO | Source: Ambulatory Visit | Attending: Orthopedic Surgery | Admitting: Orthopedic Surgery

## 2020-01-19 ENCOUNTER — Encounter (HOSPITAL_COMMUNITY)
Admission: RE | Admit: 2020-01-19 | Discharge: 2020-01-19 | Disposition: A | Discharge status: Home / Self Care | Payer: Medicare HMO | Source: Ambulatory Visit | Attending: Orthopedic Surgery | Admitting: Orthopedic Surgery

## 2020-01-19 ENCOUNTER — Encounter (HOSPITAL_COMMUNITY): Payer: Self-pay

## 2020-01-19 DIAGNOSIS — Z20822 Contact with and (suspected) exposure to covid-19: Secondary | ICD-10-CM | POA: Insufficient documentation

## 2020-01-19 DIAGNOSIS — Z01812 Encounter for preprocedural laboratory examination: Secondary | ICD-10-CM | POA: Insufficient documentation

## 2020-01-19 DIAGNOSIS — M1711 Unilateral primary osteoarthritis, right knee: Secondary | ICD-10-CM | POA: Diagnosis not present

## 2020-01-19 HISTORY — DX: Unspecified osteoarthritis, unspecified site: M19.90

## 2020-01-19 HISTORY — DX: Cardiac arrhythmia, unspecified: I49.9

## 2020-01-19 LAB — CBC WITH DIFFERENTIAL/PLATELET
Abs Immature Granulocytes: 0.03 10*3/uL (ref 0.00–0.07)
Basophils Absolute: 0.1 10*3/uL (ref 0.0–0.1)
Basophils Relative: 1 %
Eosinophils Absolute: 0.2 10*3/uL (ref 0.0–0.5)
Eosinophils Relative: 2 %
HCT: 46.2 % (ref 39.0–52.0)
Hemoglobin: 14.7 g/dL (ref 13.0–17.0)
Immature Granulocytes: 0 %
Lymphocytes Relative: 21 %
Lymphs Abs: 1.6 10*3/uL (ref 0.7–4.0)
MCH: 27.1 pg (ref 26.0–34.0)
MCHC: 31.8 g/dL (ref 30.0–36.0)
MCV: 85.2 fL (ref 80.0–100.0)
Monocytes Absolute: 0.6 10*3/uL (ref 0.1–1.0)
Monocytes Relative: 9 %
Neutro Abs: 4.9 10*3/uL (ref 1.7–7.7)
Neutrophils Relative %: 67 %
Platelets: 231 10*3/uL (ref 150–400)
RBC: 5.42 MIL/uL (ref 4.22–5.81)
RDW: 15 % (ref 11.5–15.5)
WBC: 7.3 10*3/uL (ref 4.0–10.5)
nRBC: 0 % (ref 0.0–0.2)

## 2020-01-19 LAB — BASIC METABOLIC PANEL
Anion gap: 9 (ref 5–15)
BUN: 11 mg/dL (ref 8–23)
CO2: 26 mmol/L (ref 22–32)
Calcium: 8.8 mg/dL — ABNORMAL LOW (ref 8.9–10.3)
Chloride: 103 mmol/L (ref 98–111)
Creatinine, Ser: 0.86 mg/dL (ref 0.61–1.24)
GFR, Estimated: >60 mL/min (ref >60)
Glucose, Bld: 101 mg/dL — ABNORMAL HIGH (ref 70–99)
Potassium: 3.4 mmol/L — ABNORMAL LOW (ref 3.5–5.1)
Sodium: 138 mmol/L (ref 135–145)

## 2020-01-20 LAB — SARS CORONAVIRUS 2 (TAT 6-24 HRS): SARS Coronavirus 2: NEGATIVE

## 2020-01-20 NOTE — H&P (Signed)
Chief Complaint  Patient presents with  . Knee Pain      right / several months had injection with some relief with cortizone injection / surgical evaluation       69 year old male previously treated for chronic right knee pain with exacerbation unresponsive to cortisone injection.  Patient eventually had MRI which showed a torn medial meniscus and full to partial thickness cartilage loss in the medial compartment and patellofemoral joint   Patient comes in to discuss surgical options.     Review of Systems  Constitutional: Negative for chills and fever.  Musculoskeletal: Positive for joint pain.            Past Medical History:  Diagnosis Date  . Obesity (BMI 30-39.9) 02/24/2019  . Testicular failure 02/24/2019  . Vitamin D deficiency disease 02/24/2019           Past Surgical History:  Procedure Laterality Date  . COLONOSCOPY N/A 03/10/2018    Procedure: COLONOSCOPY;  Surgeon: Daneil Dolin, MD;  Location: [facility];  Service: Endoscopy;  Laterality: N/A;  7:30  . KNEE SURGERY Right 1987  . POLYPECTOMY   03/10/2018    Procedure: POLYPECTOMY;  Surgeon: Daneil Dolin, MD;  Location: [facility];  Service: Endoscopy;;  (colon)  . Testicle removed Bilateral    . VASECTOMY   1989      Family History  Problem Relation Age of Onset  . Hypertension Mother    . Diabetes Mother    . Heart disease Father      Social History         Tobacco Use  . Smoking status: Never Smoker  . Smokeless tobacco: Never Used  Vaping Use  . Vaping Use: Never used  Substance Use Topics  . Alcohol use: Yes      Comment: Occasional  . Drug use: Never           Allergies  Allergen Reactions  . Amiodarone Rash  . Penicillins Rash      Has patient had a PCN reaction causing immediate rash, facial/tongue/throat swelling, SOB or lightheadedness with hypotension: Unknown Has patient had a PCN reaction causing severe rash involving mucus membranes or skin necrosis: No Has patient  had a PCN reaction that required hospitalization: No Has patient had a PCN reaction occurring within the last 10 years: No If all of the above answers are "NO", then may proceed with Cephalosporin use.        Active Medications      Current Meds  Medication Sig  . Ascorbic Acid (VITAMIN C PO) Take 1 tablet by mouth daily.  Marland Kitchen aspirin EC 81 MG tablet Take 81 mg by mouth daily.  . Calcium Polycarbophil (FIBER-CAPS PO) Take 5 capsules by mouth daily.  . Cholecalciferol (VITAMIN D3) 125 MCG (5000 UT) TABS Take 1 tablet by mouth daily.   . Glucosamine HCl (GLUCOSAMINE PO) Take 1 tablet by mouth daily.  Marland Kitchen testosterone cypionate (DEPOTESTOSTERONE CYPIONATE) 200 MG/ML injection INJECT 0.5 ML INTO THE MUSCLE TWICE A WEEK        BP (!) 155/94   Pulse 94   Ht 6\' 3"  (1.905 m)   Wt 295 lb (133.8 kg)   BMI 36.87 kg/m    Physical Exam Constitutional:      General: He is not in acute distress.    Appearance: He is well-developed.  Eyes:     General: No scleral icterus.       Right eye: No  discharge.        Left eye: No discharge.     Pupils: Pupils are equal, round, and reactive to light.  Cardiovascular:     Comments: No peripheral edema Musculoskeletal:     Right knee: No effusion.     Instability Tests: Medial McMurray test positive.  Skin:    General: Skin is warm and dry.  Neurological:     Mental Status: He is alert and oriented to person, place, and time.     Sensory: No sensory deficit.     Coordination: Coordination normal.     Gait: Gait normal.     Deep Tendon Reflexes: Reflexes are normal and symmetric.  Psychiatric:        Mood and Affect: Mood normal.        Behavior: Behavior normal.        Thought Content: Thought content normal.        Judgment: Judgment normal.        Right Knee Exam    Muscle Strength  The patient has normal right knee strength.   Tenderness  The patient is experiencing tenderness in the medial joint line and patella.   Range of  Motion  Extension: normal  Right knee flexion: 125.    Tests  McMurray:  Medial - positive  Varus: negative Valgus: negative Lachman:  Anterior - negative     Drawer:  Anterior - negative    Posterior - negative   Other  Erythema: absent Scars: absent Sensation: normal Pulse: present Swelling: none Effusion: no effusion present     Left Knee Exam  Left knee exam is normal.               MEDICAL DECISION MAKING   A.      Encounter Diagnosis  Name Primary?  . Derangement of posterior horn of medial meniscus of right knee Yes      B. DATA ANALYSED:   IMAGING: Interpretation of images: Plain films 3 degree valgus alignment, MRI shows torn medial meniscus and cartilage loss medial femoral condyle and patella  Orders: Surgery arthroscopy right knee partial medial meniscectomy  Outside records reviewed: None    C. MANAGEMENT    The procedure has been fully reviewed with the patient; The risks and benefits of surgery have been discussed and explained and understood. Alternative treatment has also been reviewed, questions were encouraged and answered. The postoperative plan is also been reviewed.   Discussed possible knee replacement versus arthroscopic surgery.  Based on symptoms plain films alignment overall function recommended arthroscopic surgery possibility of knee replacement in 3 to 5 years discussed weight loss as well            Arther Abbott, MD 3:10 PM 01/20/2020

## 2020-01-21 ENCOUNTER — Ambulatory Visit (HOSPITAL_COMMUNITY): Payer: Medicare HMO | Admitting: Anesthesiology

## 2020-01-21 ENCOUNTER — Other Ambulatory Visit: Payer: Self-pay | Admitting: Orthopedic Surgery

## 2020-01-21 ENCOUNTER — Ambulatory Visit (HOSPITAL_COMMUNITY)
Admission: RE | Admit: 2020-01-21 | Discharge: 2020-01-21 | Disposition: A | Discharge status: Home / Self Care | Payer: Medicare HMO | Attending: Orthopedic Surgery | Admitting: Orthopedic Surgery

## 2020-01-21 ENCOUNTER — Telehealth: Payer: Self-pay | Admitting: Orthopedic Surgery

## 2020-01-21 ENCOUNTER — Other Ambulatory Visit: Payer: Self-pay

## 2020-01-21 ENCOUNTER — Encounter (HOSPITAL_COMMUNITY): Payer: Self-pay | Admitting: Orthopedic Surgery

## 2020-01-21 ENCOUNTER — Encounter (HOSPITAL_COMMUNITY)
Admission: RE | Disposition: A | Discharge status: Home / Self Care | Payer: Self-pay | Source: Home / Self Care | Attending: Orthopedic Surgery

## 2020-01-21 DIAGNOSIS — E559 Vitamin D deficiency, unspecified: Secondary | ICD-10-CM | POA: Diagnosis not present

## 2020-01-21 DIAGNOSIS — M23321 Other meniscus derangements, posterior horn of medial meniscus, right knee: Secondary | ICD-10-CM | POA: Diagnosis not present

## 2020-01-21 DIAGNOSIS — S83281A Other tear of lateral meniscus, current injury, right knee, initial encounter: Secondary | ICD-10-CM | POA: Diagnosis not present

## 2020-01-21 DIAGNOSIS — M23261 Derangement of other lateral meniscus due to old tear or injury, right knee: Secondary | ICD-10-CM | POA: Diagnosis not present

## 2020-01-21 DIAGNOSIS — S83241D Other tear of medial meniscus, current injury, right knee, subsequent encounter: Secondary | ICD-10-CM | POA: Diagnosis not present

## 2020-01-21 DIAGNOSIS — E669 Obesity, unspecified: Secondary | ICD-10-CM | POA: Insufficient documentation

## 2020-01-21 DIAGNOSIS — S83241A Other tear of medial meniscus, current injury, right knee, initial encounter: Secondary | ICD-10-CM

## 2020-01-21 DIAGNOSIS — M1711 Unilateral primary osteoarthritis, right knee: Secondary | ICD-10-CM | POA: Insufficient documentation

## 2020-01-21 DIAGNOSIS — Z7982 Long term (current) use of aspirin: Secondary | ICD-10-CM | POA: Insufficient documentation

## 2020-01-21 DIAGNOSIS — X58XXXA Exposure to other specified factors, initial encounter: Secondary | ICD-10-CM | POA: Insufficient documentation

## 2020-01-21 DIAGNOSIS — E291 Testicular hypofunction: Secondary | ICD-10-CM | POA: Insufficient documentation

## 2020-01-21 DIAGNOSIS — Z6837 Body mass index (BMI) 37.0-37.9, adult: Secondary | ICD-10-CM | POA: Insufficient documentation

## 2020-01-21 DIAGNOSIS — Z79899 Other long term (current) drug therapy: Secondary | ICD-10-CM | POA: Diagnosis not present

## 2020-01-21 HISTORY — PX: KNEE ARTHROSCOPY WITH MEDIAL MENISECTOMY: SHX5651

## 2020-01-21 SURGERY — ARTHROSCOPY, KNEE, WITH MEDIAL MENISCECTOMY
Anesthesia: General | Site: Knee | Laterality: Right

## 2020-01-21 MED ORDER — CHLORHEXIDINE GLUCONATE 0.12 % MT SOLN
15.0000 mL | Freq: Once | OROMUCOSAL | Status: AC
  Administered 2020-01-21: 15 mL via OROMUCOSAL
  Filled 2020-01-21: qty 15

## 2020-01-21 MED ORDER — FENTANYL CITRATE (PF) 250 MCG/5ML IJ SOLN
INTRAMUSCULAR | Status: AC
  Filled 2020-01-21: qty 5

## 2020-01-21 MED ORDER — SODIUM CHLORIDE 0.9 % IR SOLN
Status: DC | PRN
  Administered 2020-01-21 (×3): 3000 mL

## 2020-01-21 MED ORDER — PROMETHAZINE HCL 25 MG/ML IJ SOLN: 6.2500 mg | INTRAMUSCULAR | Status: DC | PRN

## 2020-01-21 MED ORDER — HYDROCODONE-ACETAMINOPHEN 7.5-325 MG PO TABS: 1.0000 | ORAL_TABLET | ORAL | 0 refills | Status: DC | PRN

## 2020-01-21 MED ORDER — ONDANSETRON HCL 4 MG/2ML IJ SOLN
INTRAMUSCULAR | Status: AC
  Filled 2020-01-21: qty 2

## 2020-01-21 MED ORDER — DEXAMETHASONE SODIUM PHOSPHATE 10 MG/ML IJ SOLN
INTRAMUSCULAR | Status: AC
  Filled 2020-01-21: qty 1

## 2020-01-21 MED ORDER — HYDROMORPHONE HCL 1 MG/ML IJ SOLN
0.2500 mg | INTRAMUSCULAR | Status: DC | PRN
  Administered 2020-01-21: 0.25 mg via INTRAVENOUS
  Filled 2020-01-21: qty 0.5

## 2020-01-21 MED ORDER — BUPIVACAINE-EPINEPHRINE (PF) 0.5% -1:200000 IJ SOLN
INTRAMUSCULAR | Status: AC
  Filled 2020-01-21: qty 60

## 2020-01-21 MED ORDER — LIDOCAINE HCL (CARDIAC) PF 50 MG/5ML IV SOSY
PREFILLED_SYRINGE | INTRAVENOUS | Status: DC | PRN
  Administered 2020-01-21: 100 mg via INTRAVENOUS
  Administered 2020-01-21: 300 mg via INTRAVENOUS

## 2020-01-21 MED ORDER — HYDROCODONE-ACETAMINOPHEN 7.5-325 MG PO TABS
1.0000 | ORAL_TABLET | Freq: Once | ORAL | Status: AC
  Administered 2020-01-21: 1 via ORAL
  Filled 2020-01-21: qty 1

## 2020-01-21 MED ORDER — HYDROCODONE-ACETAMINOPHEN 7.5-325 MG PO TABS
1.0000 | ORAL_TABLET | ORAL | 0 refills | Status: DC | PRN
Start: 2020-01-21 — End: 2020-01-21

## 2020-01-21 MED ORDER — BUPIVACAINE-EPINEPHRINE (PF) 0.5% -1:200000 IJ SOLN
INTRAMUSCULAR | Status: DC | PRN
  Administered 2020-01-21: 40 mL via PERINEURAL

## 2020-01-21 MED ORDER — IBUPROFEN 400 MG PO TABS
400.0000 mg | ORAL_TABLET | Freq: Once | ORAL | Status: AC
  Administered 2020-01-21: 400 mg via ORAL
  Filled 2020-01-21: qty 1

## 2020-01-21 MED ORDER — PROPOFOL 10 MG/ML IV BOLUS
INTRAVENOUS | Status: AC
  Filled 2020-01-21: qty 60

## 2020-01-21 MED ORDER — HYDROCODONE-ACETAMINOPHEN 7.5-325 MG PO TABS
1.0000 | ORAL_TABLET | ORAL | 0 refills | Status: AC | PRN
Start: 2020-01-21 — End: 2020-01-26

## 2020-01-21 MED ORDER — LACTATED RINGERS IV SOLN: Freq: Once | INTRAVENOUS | Status: AC

## 2020-01-21 MED ORDER — ORAL CARE MOUTH RINSE: 15.0000 mL | Freq: Once | OROMUCOSAL | Status: AC

## 2020-01-21 MED ORDER — LACTATED RINGERS IV SOLN: INTRAVENOUS | Status: DC | PRN

## 2020-01-21 MED ORDER — VANCOMYCIN HCL 1500 MG/300ML IV SOLN
1500.0000 mg | INTRAVENOUS | Status: AC
  Administered 2020-01-21: 1500 mg via INTRAVENOUS
  Filled 2020-01-21: qty 300

## 2020-01-21 MED ORDER — ONDANSETRON HCL 4 MG/2ML IJ SOLN
4.0000 mg | Freq: Once | INTRAMUSCULAR | Status: AC
  Administered 2020-01-21: 4 mg via INTRAVENOUS
  Filled 2020-01-21: qty 2

## 2020-01-21 MED ORDER — FENTANYL CITRATE (PF) 100 MCG/2ML IJ SOLN
INTRAMUSCULAR | Status: DC | PRN
Start: 2020-01-21 — End: 2020-01-21
  Administered 2020-01-21 (×2): 50 ug via INTRAVENOUS

## 2020-01-21 MED ORDER — MEPERIDINE HCL 50 MG/ML IJ SOLN: 6.2500 mg | INTRAMUSCULAR | Status: DC | PRN

## 2020-01-21 MED ORDER — EPINEPHRINE PF 1 MG/ML IJ SOLN
INTRAMUSCULAR | Status: AC
  Filled 2020-01-21: qty 8

## 2020-01-21 MED ORDER — MIDAZOLAM HCL 5 MG/5ML IJ SOLN
INTRAMUSCULAR | Status: DC | PRN
  Administered 2020-01-21: 2 mg via INTRAVENOUS

## 2020-01-21 MED ORDER — SODIUM CHLORIDE 0.9 % IR SOLN
Status: DC | PRN
  Administered 2020-01-21: 1000 mL

## 2020-01-21 MED ORDER — LIDOCAINE 2% (20 MG/ML) 5 ML SYRINGE
INTRAMUSCULAR | Status: AC
  Filled 2020-01-21: qty 5

## 2020-01-21 MED ORDER — MIDAZOLAM HCL 2 MG/2ML IJ SOLN
INTRAMUSCULAR | Status: AC
  Filled 2020-01-21: qty 2

## 2020-01-21 MED ORDER — ONDANSETRON HCL 4 MG/2ML IJ SOLN
INTRAMUSCULAR | Status: DC | PRN
  Administered 2020-01-21: 4 mg via INTRAVENOUS

## 2020-01-21 SURGICAL SUPPLY — 54 items
APL PRP STRL LF DISP 70% ISPRP (MISCELLANEOUS) ×1
BAG HAMPER (MISCELLANEOUS) ×3 IMPLANT
BLADE SHAVER TORPEDO 4X13 (MISCELLANEOUS) ×3 IMPLANT
BLADE SURG SZ11 CARB STEEL (BLADE) ×3 IMPLANT
BNDG CMPR STD VLCR NS LF 5.8X6 (GAUZE/BANDAGES/DRESSINGS) ×1
BNDG ELASTIC 6X5.8 VLCR NS LF (GAUZE/BANDAGES/DRESSINGS) ×3 IMPLANT
CHLORAPREP W/TINT 26 (MISCELLANEOUS) ×3 IMPLANT
CLOTH BEACON ORANGE TIMEOUT ST (SAFETY) ×3 IMPLANT
COOLER ICEMAN CLASSIC (MISCELLANEOUS) ×3 IMPLANT
COVER WAND RF STERILE (DRAPES) ×6 IMPLANT
CUFF TOURN SGL QUICK 34 (TOURNIQUET CUFF) ×3
CUFF TRNQT CYL 34X4.125X (TOURNIQUET CUFF) ×1 IMPLANT
DECANTER SPIKE VIAL GLASS SM (MISCELLANEOUS) ×6 IMPLANT
DRAPE HALF SHEET 40X57 (DRAPES) ×3 IMPLANT
GAUZE 4X4 16PLY RFD (DISPOSABLE) ×3 IMPLANT
GAUZE SPONGE 4X4 12PLY STRL (GAUZE/BANDAGES/DRESSINGS) ×3 IMPLANT
GAUZE SPONGE 4X4 16PLY XRAY LF (GAUZE/BANDAGES/DRESSINGS) ×3 IMPLANT
GAUZE XEROFORM 5X9 LF (GAUZE/BANDAGES/DRESSINGS) ×3 IMPLANT
GLOVE BIOGEL M 6.5 STRL (GLOVE) ×3 IMPLANT
GLOVE BIOGEL PI IND STRL 7.0 (GLOVE) ×2 IMPLANT
GLOVE BIOGEL PI INDICATOR 7.0 (GLOVE) ×4
GLOVE SKINSENSE NS SZ8.0 LF (GLOVE) ×2
GLOVE SKINSENSE STRL SZ8.0 LF (GLOVE) ×1 IMPLANT
GLOVE SS BIOGEL STRL SZ 6.5 (GLOVE) ×1 IMPLANT
GLOVE SS BIOGEL STRL SZ 7 (GLOVE) ×1 IMPLANT
GLOVE SS N UNI LF 8.5 STRL (GLOVE) ×3 IMPLANT
GLOVE SUPERSENSE BIOGEL SZ 6.5 (GLOVE) ×2
GLOVE SUPERSENSE BIOGEL SZ 7 (GLOVE) ×2
GOWN STRL REUS W/TWL LRG LVL3 (GOWN DISPOSABLE) ×3 IMPLANT
GOWN STRL REUS W/TWL XL LVL3 (GOWN DISPOSABLE) ×3 IMPLANT
IV NS IRRIG 3000ML ARTHROMATIC (IV SOLUTION) ×9 IMPLANT
KIT BLADEGUARD II DBL (SET/KITS/TRAYS/PACK) ×3 IMPLANT
KIT TURNOVER CYSTO (KITS) ×3 IMPLANT
MANIFOLD NEPTUNE II (INSTRUMENTS) ×3 IMPLANT
MARKER SKIN DUAL TIP RULER LAB (MISCELLANEOUS) ×3 IMPLANT
NEEDLE HYPO 18GX1.5 BLUNT FILL (NEEDLE) ×3 IMPLANT
NEEDLE HYPO 21X1.5 SAFETY (NEEDLE) ×3 IMPLANT
NEEDLE SPNL 18GX3.5 QUINCKE PK (NEEDLE) ×3 IMPLANT
NS IRRIG 1000ML POUR BTL (IV SOLUTION) ×3 IMPLANT
PACK ARTHRO LIMB DRAPE STRL (MISCELLANEOUS) ×3 IMPLANT
PAD ABD 5X9 TENDERSORB (GAUZE/BANDAGES/DRESSINGS) ×3 IMPLANT
PAD ARMBOARD 7.5X6 YLW CONV (MISCELLANEOUS) ×3 IMPLANT
PAD COLD SHLDR WRAP-ON (PAD) ×3 IMPLANT
PADDING CAST COTTON 6X4 STRL (CAST SUPPLIES) ×3 IMPLANT
PORT APPOLLO RF 90DEGREE MULTI (SURGICAL WAND) ×3 IMPLANT
RESECTOR TORPEDO 4MM 13CM CVD (MISCELLANEOUS) ×3 IMPLANT
SET ARTHROSCOPY INST (INSTRUMENTS) ×3 IMPLANT
SET BASIN LINEN APH (SET/KITS/TRAYS/PACK) ×3 IMPLANT
SUT ETHILON 3 0 FSL (SUTURE) ×3 IMPLANT
SYR 10ML LL (SYRINGE) ×3 IMPLANT
SYR 30ML LL (SYRINGE) ×3 IMPLANT
TUBE CONNECTING 12'X1/4 (SUCTIONS) ×2
TUBE CONNECTING 12X1/4 (SUCTIONS) ×4 IMPLANT
TUBING IN/OUT FLOW W/MAIN PUMP (TUBING) ×3 IMPLANT

## 2020-01-21 NOTE — Telephone Encounter (Signed)
Patient's wife/emergency contact called to inquire about medication following surgery - said prescriptions not at [facility], [facility]; chart notes indicate have been sent there. She had hung up before I could get back on line - aware I was pulling up in system to try to assist.

## 2020-01-21 NOTE — Discharge Instructions (Signed)
General Anesthesia, Adult, Care After This sheet gives you information about how to care for yourself after your procedure. Your health care provider may also give you more specific instructions. If you have problems or questions, contact your health care provider. What can I expect after the procedure? After the procedure, the following side effects are common:  Pain or discomfort at the IV site.  Nausea.  Vomiting.  Sore throat.  Trouble concentrating.  Feeling cold or chills.  Weak or tired.  Sleepiness and fatigue.  Soreness and body aches. These side effects can affect parts of the body that were not involved in surgery. Follow these instructions at home:  For at least 24 hours after the procedure:  Have a responsible adult stay with you. It is important to have someone help care for you until you are awake and alert.  Rest as needed.  Do not: ? Participate in activities in which you could fall or become injured. ? Drive. ? Use heavy machinery. ? Drink alcohol. ? Take sleeping pills or medicines that cause drowsiness. ? Make important decisions or sign legal documents. ? Take care of children on your own. Eating and drinking  Follow any instructions from your health care provider about eating or drinking restrictions.  When you feel hungry, start by eating small amounts of foods that are soft and easy to digest (bland), such as toast. Gradually return to your regular diet.  Drink enough fluid to keep your urine pale yellow.  If you vomit, rehydrate by drinking water, juice, or clear broth. General instructions  If you have sleep apnea, surgery and certain medicines can increase your risk for breathing problems. Follow instructions from your health care provider about wearing your sleep device: ? Anytime you are sleeping, including during daytime naps. ? While taking prescription pain medicines, sleeping medicines, or medicines that make you drowsy.  Return to  your normal activities as told by your health care provider. Ask your health care provider what activities are safe for you.  Take over-the-counter and prescription medicines only as told by your health care provider.  If you smoke, do not smoke without supervision.  Keep all follow-up visits as told by your health care provider. This is important. Contact a health care provider if:  You have nausea or vomiting that does not get better with medicine.  You cannot eat or drink without vomiting.  You have pain that does not get better with medicine.  You are unable to pass urine.  You develop a skin rash.  You have a fever.  You have redness around your IV site that gets worse. Get help right away if:  You have difficulty breathing.  You have chest pain.  You have blood in your urine or stool, or you vomit blood. Summary  After the procedure, it is common to have a sore throat or nausea. It is also common to feel tired.  Have a responsible adult stay with you for the first 24 hours after general anesthesia. It is important to have someone help care for you until you are awake and alert.  When you feel hungry, start by eating small amounts of foods that are soft and easy to digest (bland), such as toast. Gradually return to your regular diet.  Drink enough fluid to keep your urine pale yellow.  Return to your normal activities as told by your health care provider. Ask your health care provider what activities are safe for you. This information is not   intended to replace advice given to you by your health care provider. Make sure you discuss any questions you have with your health care provider. Document Revised: 03/28/2017 Document Reviewed: 11/08/2016 Elsevier Patient Education  2020 [employer]  

## 2020-01-21 NOTE — Progress Notes (Signed)
Rx given to patient's wife Harold West

## 2020-01-21 NOTE — Op Note (Signed)
01/21/2020  8:47 AM  PATIENT:  Harold West  70 y.o. male  PRE-OPERATIVE DIAGNOSIS:  torn medial meniscus right knee  POST-OPERATIVE DIAGNOSIS:  torn medial meniscus right knee lateral meniscal tear degenerative arthritis 3 compartments  PROCEDURE:  Procedure(s): KNEE ARTHROSCOPY WITH MEDIAL AND LATERAL MENISECTOMY (Right) - 29880  Findings at surgery: Torn medial meniscus root radial tear torn lateral meniscus root radial tear free edge tearing lateral meniscus grade IV chondromalacia lateral patella grade 3 diffuse chondromalacia trochlea grade 2 diffuse chondral malacia medial femoral condyle grade 2 focal lateral femoral condyle chondromalacia   SURGEON:  Surgeon(s) and Role:    Carole Civil, MD - Primary  No assistants  LMA general anesthesia    EBL:  10 mL   BLOOD ADMINISTERED:none  DRAINS: none   LOCAL MEDICATIONS USED: 40 cc Marcaine with epinephrine  SPECIMEN:  No Specimen  DISPOSITION OF SPECIMEN:  N/A  COUNTS:  YES  TOURNIQUET:  * Missing tourniquet times found for documented tourniquets in log: [id number] *  DICTATION: .Dragon Dictation  PLAN OF CARE: Discharge to home after PACU  PATIENT DISPOSITION:  PACU - hemodynamically stable.   Delay start of Pharmacological VTE agent (>24hrs) due to surgical blood loss or risk of bleeding: not applicable  Harold West was seen in preop his right knee was confirmed the surgical site and marked  Chart review was completed  Patient was cleared for surgery  Patient was taken to surgery for general LMA anesthesia  He was in the supine position.  The end of the table was taken and flexed  After sterile prep and drape and timeout a lateral portal was established the scope was placed into the joint diagnostic arthroscopy was completed  A medial portal was established with the assistance of a spinal needle.  Once the medial portal was established US probe was placed into the joint and the intra-articular  structures were examined  He had a radial tear at the root of the medial meniscus this was resected with a shaver and upbiter.  The meniscus was balanced with a shaver until a stable rim was confirmed  We then took our attention to the lateral compartment where he had free edge tearing of the lateral meniscus which was handled with a straight shaver we then used a 90 degree wand to address the radial root tear of the lateral meniscus  The joint was irrigated and suctioned dry injected with 40 cc of Marcaine with epinephrine and 2, 3-0 nylon sutures were used to close the wounds  Sterile dressings were applied along with a Cryo/Cuff and he was extubated and taken to recovery room in stable condition

## 2020-01-21 NOTE — Transfer of Care (Signed)
Immediate Anesthesia Transfer of Care Note  Patient: Harold West  Procedure(s) Performed: KNEE ARTHROSCOPY WITH MEDIAL AND LATERAL MENISECTOMY (Right Knee)  Patient Location: PACU  Anesthesia Type:General  Level of Consciousness: awake  Airway & Oxygen Therapy: Patient Spontanous Breathing  Post-op Assessment: Report given to RN  Post vital signs: Reviewed and stable  Last Vitals:  Vitals Value Taken Time  BP 111/59 01/21/20 0849  Temp    Pulse 80 01/21/20 0851  Resp 12 01/21/20 0851  SpO2 97 % 01/21/20 0851  Vitals shown include unvalidated device data.  Last Pain:  Vitals:   01/21/20 0643  TempSrc: Oral  PainSc: 0-No pain      Patients Stated Pain Goal: 6 (93/[id number])  Complications: No complications documented.

## 2020-01-21 NOTE — Brief Op Note (Signed)
01/21/2020  8:43 AM  PATIENT:  Harold West  69 y.o. male  PRE-OPERATIVE DIAGNOSIS:  torn medial meniscus right knee  POST-OPERATIVE DIAGNOSIS:  torn medial meniscus right knee lateral meniscal tear degenerative arthritis 3 compartments  PROCEDURE:  Procedure(s): KNEE ARTHROSCOPY WITH MEDIAL AND LATERAL MENISECTOMY (Right) - 29880  Findings at surgery: Torn medial meniscus root radial tear torn lateral meniscus root radial tear free edge tearing lateral meniscus grade IV chondromalacia lateral patella grade 3 diffuse chondromalacia trochlea grade 2 diffuse chondral malacia medial femoral condyle grade 2 focal lateral femoral condyle chondromalacia   SURGEON:  Surgeon(s) and Role:    Carole Civil, MD - Primary  No assistants  LMA general anesthesia    EBL:  10 mL   BLOOD ADMINISTERED:none  DRAINS: none   LOCAL MEDICATIONS USED: 40 cc Marcaine with epinephrine  SPECIMEN:  No Specimen  DISPOSITION OF SPECIMEN:  N/A  COUNTS:  YES  TOURNIQUET:  * Missing tourniquet times found for documented tourniquets in log: [id number] *  DICTATION: .Dragon Dictation  PLAN OF CARE: Discharge to home after PACU  PATIENT DISPOSITION:  PACU - hemodynamically stable.   Delay start of Pharmacological VTE agent (>24hrs) due to surgical blood loss or risk of bleeding: not applicable  Harold West was seen in preop his right knee was confirmed the surgical site and marked  Chart review was completed  Patient was cleared for surgery  Patient was taken to surgery for general LMA anesthesia  He was in the supine position.  The end of the table was taken and flexed  After sterile prep and drape and timeout a lateral portal was established the scope was placed into the joint diagnostic arthroscopy was completed  A medial portal was established with the assistance of a spinal needle.  Once the medial portal was established US probe was placed into the joint and the intra-articular  structures were examined  He had a radial tear at the root of the medial meniscus this was resected with a shaver and upbiter.  The meniscus was balanced with a shaver until a stable rim was confirmed  We then took our attention to the lateral compartment where he had free edge tearing of the lateral meniscus which was handled with a straight shaver we then used a 90 degree wand to address the radial root tear of the lateral meniscus  The joint was irrigated and suctioned dry injected with 40 cc of Marcaine with epinephrine and 2, 3-0 nylon sutures were used to close the wounds  Sterile dressings were applied along with a Cryo/Cuff and he was extubated and taken to recovery room in stable condition

## 2020-01-21 NOTE — Anesthesia Postprocedure Evaluation (Signed)
Anesthesia Post Note  Patient: Harold West  Procedure(s) Performed: KNEE ARTHROSCOPY WITH MEDIAL AND LATERAL MENISECTOMY (Right Knee)  Patient location during evaluation: PACU Anesthesia Type: General Level of consciousness: awake and alert Pain management: pain level controlled Vital Signs Assessment: post-procedure vital signs reviewed and stable Respiratory status: spontaneous breathing Cardiovascular status: blood pressure returned to baseline and stable Postop Assessment: no apparent nausea or vomiting Anesthetic complications: no   No complications documented.   Last Vitals:  Vitals:   01/21/20 0845 01/21/20 0900  BP: (!) 111/59 101/70  Pulse: 82 73  Resp: 13 12  Temp: 36.7 C   SpO2: 97% 95%    Last Pain:  Vitals:   01/21/20 0900  TempSrc:   PainSc: 0-No pain                 Cohl Behrens

## 2020-01-21 NOTE — Anesthesia Preprocedure Evaluation (Signed)
Anesthesia Evaluation  Patient identified by MRN, date of birth, ID band Patient awake    Reviewed: Allergy & Precautions, NPO status , Patient's Chart, lab work & pertinent test results  History of Anesthesia Complications Negative for: history of anesthetic complications  Airway Mallampati: II  TM Distance: >3 FB Neck ROM: Full    Dental  (+) Dental Advisory Given, Teeth Intact   Pulmonary neg pulmonary ROS,    Pulmonary exam normal breath sounds clear to auscultation       Cardiovascular Exercise Tolerance: Good Normal cardiovascular exam+ dysrhythmias (resolved after ablationx2, ) Atrial Fibrillation  Rhythm:Regular Rate:Normal     Neuro/Psych negative neurological ROS  negative psych ROS   GI/Hepatic negative GI ROS, (+)     substance abuse  alcohol use,   Endo/Other  negative endocrine ROS  Renal/GU negative Renal ROS     Musculoskeletal  (+) Arthritis , Osteoarthritis,    Abdominal   Peds  Hematology negative hematology ROS (+)   Anesthesia Other Findings   Reproductive/Obstetrics                            Anesthesia Physical Anesthesia Plan  ASA: II  Anesthesia Plan: General   Post-op Pain Management:    Induction: Intravenous  PONV Risk Score and Plan: Ondansetron, Dexamethasone and Midazolam  Airway Management Planned: LMA  Additional Equipment:   Intra-op Plan:   Post-operative Plan: Extubation in OR  Informed Consent:     Dental advisory given  Plan Discussed with: CRNA and Surgeon  Anesthesia Plan Comments:         Anesthesia Quick Evaluation

## 2020-01-21 NOTE — Progress Notes (Signed)
Dr. Aline Brochure made aware of electronic RX has not been received at [facility]. He will write a paper Rx. Wife Coltrane Tugwell notified to come pick the Rx up. She has gotten patient home. Next pain med due at 1315. She will return to short stay and pick up rx today

## 2020-01-21 NOTE — Brief Op Note (Signed)
01/21/2020  8:47 AM  PATIENT:  Harold West  69 y.o. male  PRE-OPERATIVE DIAGNOSIS:  torn medial meniscus right knee  POST-OPERATIVE DIAGNOSIS:  torn medial meniscus right knee lateral meniscal tear degenerative arthritis 3 compartments  PROCEDURE:  Procedure(s): KNEE ARTHROSCOPY WITH MEDIAL AND LATERAL MENISECTOMY (Right) - 29880  Findings at surgery: Torn medial meniscus root radial tear torn lateral meniscus root radial tear free edge tearing lateral meniscus grade IV chondromalacia lateral patella grade 3 diffuse chondromalacia trochlea grade 2 diffuse chondral malacia medial femoral condyle grade 2 focal lateral femoral condyle chondromalacia   SURGEON:  Surgeon(s) and Role:    Carole Civil, MD - Primary  No assistants  LMA general anesthesia    EBL:  10 mL   BLOOD ADMINISTERED:none  DRAINS: none   LOCAL MEDICATIONS USED: 40 cc Marcaine with epinephrine  SPECIMEN:  No Specimen  DISPOSITION OF SPECIMEN:  N/A  COUNTS:  YES  TOURNIQUET:  * Missing tourniquet times found for documented tourniquets in log: [id number] *  DICTATION: .Dragon Dictation  PLAN OF CARE: Discharge to home after PACU  PATIENT DISPOSITION:  PACU - hemodynamically stable.   Delay start of Pharmacological VTE agent (>24hrs) due to surgical blood loss or risk of bleeding: not applicable  [name] was seen in preop his right knee was confirmed the surgical site and marked  Chart review was completed  Patient was cleared for surgery  Patient was taken to surgery for general LMA anesthesia  He was in the supine position.  The end of the table was taken and flexed  After sterile prep and drape and timeout a lateral portal was established the scope was placed into the joint diagnostic arthroscopy was completed  A medial portal was established with the assistance of a spinal needle.  Once the medial portal was established US probe was placed into the joint and the intra-articular  structures were examined  He had a radial tear at the root of the medial meniscus this was resected with a shaver and upbiter.  The meniscus was balanced with a shaver until a stable rim was confirmed  We then took our attention to the lateral compartment where he had free edge tearing of the lateral meniscus which was handled with a straight shaver we then used a 90 degree wand to address the radial root tear of the lateral meniscus  The joint was irrigated and suctioned dry injected with 40 cc of Marcaine with epinephrine and 2, 3-0 nylon sutures were used to close the wounds  Sterile dressings were applied along with a Cryo/Cuff and he was extubated and taken to recovery room in stable condition

## 2020-01-21 NOTE — Anesthesia Procedure Notes (Addendum)
Procedure Name: LMA Insertion Date/Time: 01/21/2020 7:43 AM Performed by: Ollen Bowl, CRNA Pre-anesthesia Checklist: Patient identified, Patient being monitored, Emergency Drugs available, Timeout performed and Suction available Patient Re-evaluated:Patient Re-evaluated prior to induction Oxygen Delivery Method: Circle System Utilized Preoxygenation: Pre-oxygenation with 100% oxygen Induction Type: IV induction Ventilation: Mask ventilation without difficulty LMA: LMA inserted LMA Size: 5.0 Number of attempts: 1 Placement Confirmation: positive ETCO2 and breath sounds checked- equal and bilateral

## 2020-01-21 NOTE — Interval H&P Note (Signed)
History and Physical Interval Note:  01/21/2020 7:26 AM  Harold West  has presented today for surgery, with the diagnosis of torn medial meniscus right knee.  The various methods of treatment have been discussed with the patient and family. After consideration of risks, benefits and other options for treatment, the patient has consented to  Procedure(s): KNEE ARTHROSCOPY WITH MEDIAL MENISECTOMY (Right) as a surgical intervention.  The patient's history has been reviewed, patient examined, no change in status, stable for surgery.  I have reviewed the patient's chart and labs.  Questions were answered to the patient's satisfaction.     Arther Abbott

## 2020-01-24 ENCOUNTER — Encounter (HOSPITAL_COMMUNITY): Payer: Self-pay | Admitting: Orthopedic Surgery

## 2020-01-24 DIAGNOSIS — Z9889 Other specified postprocedural states: Secondary | ICD-10-CM | POA: Insufficient documentation

## 2020-01-26 ENCOUNTER — Other Ambulatory Visit: Payer: Self-pay

## 2020-01-26 ENCOUNTER — Encounter: Payer: Self-pay | Admitting: Orthopedic Surgery

## 2020-01-26 ENCOUNTER — Ambulatory Visit (INDEPENDENT_AMBULATORY_CARE_PROVIDER_SITE_OTHER): Payer: Medicare HMO | Admitting: Orthopedic Surgery

## 2020-01-26 DIAGNOSIS — Z9889 Other specified postprocedural states: Secondary | ICD-10-CM

## 2020-01-26 NOTE — Progress Notes (Signed)
POST OP   No chief complaint on file.   01/21/2020  8:47 AM  PATIENT:  Harold West  69 y.o. male  PRE-OPERATIVE DIAGNOSIS:  torn medial meniscus right knee  POST-OPERATIVE DIAGNOSIS:  torn medial meniscus right knee lateral meniscal tear degenerative arthritis 3 compartments  PROCEDURE:  Procedure(s): KNEE ARTHROSCOPY WITH MEDIAL AND LATERAL MENISECTOMY (Right) - 29880  Findings at surgery:  Torn medial meniscus root radial tear  torn lateral meniscus root radial tear  free edge tearing lateral meniscus  grade IV chondromalacia lateral patella  grade 3 diffuse chondromalacia trochlea  grade 2 diffuse chondral malacia medial femoral condyle  grade 2 focal lateral femoral condyle chondromalacia  SUTURES OUT   PORTALS CLEAN   SWOLLEN JOINT MEDIAL BRUISING FROM MCL RELEASING SPONTANEOUSLY   START HEP   GUARDED PROGNOSIS BASED ON AMT OF OA PRESENT   FU 3 WEEKS

## 2020-02-17 ENCOUNTER — Ambulatory Visit: Payer: Medicare HMO | Admitting: Orthopedic Surgery

## 2020-02-19 DIAGNOSIS — R69 Illness, unspecified: Secondary | ICD-10-CM | POA: Diagnosis not present

## 2020-02-21 ENCOUNTER — Ambulatory Visit (INDEPENDENT_AMBULATORY_CARE_PROVIDER_SITE_OTHER): Payer: Medicare HMO | Admitting: Orthopedic Surgery

## 2020-02-21 ENCOUNTER — Encounter: Payer: Self-pay | Admitting: Orthopedic Surgery

## 2020-02-21 ENCOUNTER — Other Ambulatory Visit: Payer: Self-pay

## 2020-02-21 VITALS — BP 165/88 | HR 74

## 2020-02-21 DIAGNOSIS — Z9889 Other specified postprocedural states: Secondary | ICD-10-CM

## 2020-02-21 NOTE — Progress Notes (Signed)
Chief Complaint  Patient presents with  . Routine Post Op    right knee 01/21/20     POST OP / DOS 10/15  4 WEEKS POST OP   PROCEDURE:  Procedure(s): KNEE ARTHROSCOPY WITH MEDIAL AND LATERAL MENISECTOMY (Right) - 29880   Findings at surgery:  Torn medial meniscus root radial tear  torn lateral meniscus root radial tear  free edge tearing lateral meniscus  grade IV chondromalacia lateral patella  grade 3 diffuse chondromalacia trochlea  grade 2 diffuse chondral malacia medial femoral condyle  grade 2 focal lateral femoral condyle chondromalacia   PAIN LEVEL MILD  SWELLING MOD  ROM 5-115  Encounter Diagnosis  Name Primary?  . S/P right knee arthroscopy 01/21/20 Yes     FU 6 WEEKS  ICE AS NEDED  IBUPROFEN AS NEEDED

## 2020-02-21 NOTE — Patient Instructions (Addendum)
YOU MAY SEE SWELLING AS YOUR ACTIVITY INCREASES, APPLY ICE AND TAKE IBUPROFEN ASNEEDED   ICE THE KNEE 3 X A DAY   EXERCISES CONTINUE

## 2020-03-15 ENCOUNTER — Other Ambulatory Visit: Payer: Self-pay

## 2020-03-15 ENCOUNTER — Ambulatory Visit (INDEPENDENT_AMBULATORY_CARE_PROVIDER_SITE_OTHER): Payer: Medicare HMO | Admitting: Internal Medicine

## 2020-03-15 ENCOUNTER — Encounter (INDEPENDENT_AMBULATORY_CARE_PROVIDER_SITE_OTHER): Payer: Self-pay | Admitting: Internal Medicine

## 2020-03-15 VITALS — BP 138/82 | HR 89 | Temp 97.3°F | Resp 18 | Ht 75.0 in | Wt 308.4 lb

## 2020-03-15 DIAGNOSIS — E669 Obesity, unspecified: Secondary | ICD-10-CM

## 2020-03-15 DIAGNOSIS — E291 Testicular hypofunction: Secondary | ICD-10-CM

## 2020-03-15 MED ORDER — TESTOSTERONE 20 % CREA: 100.0000 mg | TOPICAL_CREAM | Freq: Two times a day (BID) | 0 refills | Status: DC

## 2020-03-15 NOTE — Progress Notes (Signed)
Metrics: Intervention Frequency ACO  Documented Smoking Status Yearly  Screened one or more times in 24 months  Cessation Counseling or  Active cessation medication Past 24 months  Past 24 months   Guideline developer: UpToDate (See UpToDate for funding source) Date Released: 2014       Wellness Office Visit  Subjective:  Patient ID: Harold West, male    DOB: Sep 10, 1950  Age: 69 y.o. MRN: [id number]  CC: This man comes in for follow-up regarding his testosterone therapy and obesity. HPI  He tells me that since he has retired, he feels he is becoming much older.  He says that he normally walks and moves slowly but this has been exacerbated now that he is not working. He also describes poor libido and testosterone that he is currently taking at the dose that he is taking does not seem to help him. Past Medical History:  Diagnosis Date  . Arthritis   . Dysrhythmia    history of AFIB  . Obesity (BMI 30-39.9) 02/24/2019  . Testicular failure 02/24/2019  . Vitamin D deficiency disease 02/24/2019   Past Surgical History:  Procedure Laterality Date  . COLONOSCOPY N/A 03/10/2018   Procedure: COLONOSCOPY;  Surgeon: Daneil Dolin, MD;  [facility];  Service: Endoscopy;  Laterality: N/A;  7:30  . KNEE ARTHROSCOPY WITH MEDIAL MENISECTOMY Right 01/21/2020   Procedure: KNEE ARTHROSCOPY WITH MEDIAL AND LATERAL MENISECTOMY;  Surgeon: Carole Civil, MD;  Location: [facility];  Service: Orthopedics;  Laterality: Right;  . KNEE SURGERY Right 1987  . POLYPECTOMY  03/10/2018   Procedure: POLYPECTOMY;  Surgeon: Daneil Dolin, MD;  [facility];  Service: Endoscopy;;  (colon)  . Testicle removed Left   . VASECTOMY  1989     Family History  Problem Relation Age of Onset  . Hypertension Mother   . Diabetes Mother   . Heart disease Father     Social History   Social History Narrative   Married for 44 years.Lives with wife.Retired August 2020.   Social History    Tobacco Use  . Smoking status: Never Smoker  . Smokeless tobacco: Never Used  Substance Use Topics  . Alcohol use: Yes    Comment: Occasional    Current Meds  Medication Sig  . Ascorbic Acid (VITAMIN C) 1000 MG tablet Take 1,000 mg by mouth daily.  Marland Kitchen aspirin EC 81 MG tablet Take 81 mg by mouth daily.  . Calcium Polycarbophil (FIBER-CAPS PO) Take 5 capsules by mouth daily.  . Cholecalciferol (VITAMIN D3) 125 MCG (5000 UT) TABS Take 10,000 Units by mouth daily.   . Glucosamine-Chondroit-Vit C-Mn (GLUCOSAMINE 1500 COMPLEX PO) Take 2 tablets by mouth daily.  Marland Kitchen OVER THE COUNTER MEDICATION Take 1 tablet by mouth daily. ketogenic weight loss complex  . Rhodiola rosea (RHODIOLA PO) Take 1 capsule by mouth daily.  Marland Kitchen testosterone cypionate (DEPOTESTOSTERONE CYPIONATE) 200 MG/ML injection INJECT 0.5 ML INTO THE MUSCLE TWICE A WEEK (Patient taking differently: Inject 100 mg into the muscle 2 (two) times a week. Saturdays and Wednesdays)      Depression screen PHQ 2/9 09/02/2019  Decreased Interest 0  Down, Depressed, Hopeless 0  PHQ - 2 Score 0     Objective:   Today's Vitals: BP 138/82 (BP Location: Right Arm, Patient Position: Sitting, Cuff Size: Normal)   Pulse 89   Temp (!) 97.3 F (36.3 C) (Temporal)   Resp 18   Ht 6\' 3"  (1.905 m)   Wt Marland Kitchen)  308 lb 6.4 oz (139.9 kg)   SpO2 94%   BMI 38.55 kg/m  Vitals with BMI 03/15/2020 02/21/2020 01/21/2020  Height 6\' 3"  - -  Weight 308 lbs 6 oz - -  BMI 26.41 - -  Systolic 583 094 076  Diastolic 82 88 69  Pulse 89 74 68     Physical Exam  He remains obese.  Blood pressure acceptable.     Assessment   1. Testicular failure   2. Obesity (BMI 30-39.9)       Tests ordered No orders of the defined types were placed in this encounter.    Plan: 1. After shared decision making, we going to change his testosterone to testosterone cream 20%, 100 mg twice a day apply to the scrotal skin.  I have called the prescription to  [facility].  I also recommended DHEA 50 mg once a day from life [facility]. 2. I encouraged constant and measured movement throughout the whole day. 3. Follow-up in 3 months.   Meds ordered this encounter  Medications  . Testosterone 20 % CREA    Sig: Apply 100 mg topically 2 (two) times daily.    Dispense:  100 g    Refill:  0    Roanna Reaves Luther Parody, MD

## 2020-04-03 ENCOUNTER — Ambulatory Visit: Payer: Medicare HMO | Admitting: Orthopedic Surgery

## 2020-04-03 ENCOUNTER — Encounter: Payer: Self-pay | Admitting: Orthopedic Surgery

## 2020-04-03 ENCOUNTER — Other Ambulatory Visit: Payer: Self-pay

## 2020-04-03 VITALS — Ht 75.0 in | Wt 308.0 lb

## 2020-04-03 DIAGNOSIS — M1710 Unilateral primary osteoarthritis, unspecified knee: Secondary | ICD-10-CM

## 2020-04-03 DIAGNOSIS — Z9889 Other specified postprocedural states: Secondary | ICD-10-CM

## 2020-04-03 DIAGNOSIS — M25561 Pain in right knee: Secondary | ICD-10-CM

## 2020-04-03 DIAGNOSIS — G8929 Other chronic pain: Secondary | ICD-10-CM

## 2020-04-03 NOTE — Progress Notes (Signed)
POST OP VISIT   Chief Complaint  Patient presents with  . Follow-up    Recheck on right knee, DOS 01-21-20.    Encounter Diagnoses  Name Primary?  . S/P right knee arthroscopy 01/21/20 Yes  . Chronic pain of right knee   . Primary localized osteoarthritis of knee    meds Glucosamine   POV # 3  DOS 01/21/20  OP NOTE PROCEDURE:  Procedure(s): KNEE ARTHROSCOPY WITH MEDIAL AND LATERAL MENISECTOMY (Right) - 29880   Findings at surgery:  Torn medial meniscus root radial tear  torn lateral meniscus root radial tear  free edge tearing lateral meniscus  grade IV chondromalacia lateral patella  grade 3 diffuse chondromalacia trochlea  grade 2 diffuse chondral malacia medial femoral condyle  grade 2 focal lateral femoral condyle chondromalacia   DATA:   69 year old male 2 months after knee arthroscopy says he has really no pain he does have some stiffness after sitting and notices some stiffness when he first starts to ride the bike   OBJECTIVE FINDINGS :   A little knee effusion full extension may be a little slight decrease in extension he really look at it closely  His knee flexion is past 115 degrees  ASSESSMENT AND PLAN   Encounter Diagnoses  Name Primary?  . S/P right knee arthroscopy 01/21/20 Yes  . Chronic pain of right knee   . Primary localized osteoarthritis of knee     Doing well better than expected based on the cartilage changes we saw on the joint surfaces, recommend activities as tolerated call us if he has persistent swelling that does not resolve with ice he can take anti-inflammatories for any mild pain that he has

## 2020-05-09 ENCOUNTER — Encounter (INDEPENDENT_AMBULATORY_CARE_PROVIDER_SITE_OTHER): Payer: Self-pay | Admitting: Internal Medicine

## 2020-05-09 ENCOUNTER — Other Ambulatory Visit: Payer: Self-pay

## 2020-05-09 ENCOUNTER — Ambulatory Visit (INDEPENDENT_AMBULATORY_CARE_PROVIDER_SITE_OTHER): Payer: Medicare HMO | Admitting: Internal Medicine

## 2020-05-09 VITALS — BP 124/76 | HR 76 | Temp 97.3°F | Ht 75.0 in | Wt 306.8 lb

## 2020-05-09 DIAGNOSIS — M545 Low back pain, unspecified: Secondary | ICD-10-CM

## 2020-05-09 NOTE — Progress Notes (Signed)
Metrics: Intervention Frequency ACO  Documented Smoking Status Yearly  Screened one or more times in 24 months  Cessation Counseling or  Active cessation medication Past 24 months  Past 24 months   Guideline developer: UpToDate (See UpToDate for funding source) Date Released: 2014       Wellness Office Visit  Subjective:  Patient ID: Harold West, male    DOB: 04/30/1950  Age: 70 y.o. MRN: [id number]  CC: Right-sided low back pain HPI  Patient comes in for an acute visit with the above symptoms which have been present for the last 2 weeks.  He denies any strenuous activity or heavy lifting. The pain is usually worse with movement and does not radiate down his legs.  The pain is intermittent and he takes ibuprofen over-the-counter only when required.  The ibuprofen does seem to help.  He has no urinary symptoms whatsoever especially hematuria or dysuria.  He has no nausea or vomiting. He denies any traumatic injury. Past Medical History:  Diagnosis Date  . Arthritis   . Dysrhythmia    history of AFIB  . Obesity (BMI 30-39.9) 02/24/2019  . Testicular failure 02/24/2019  . Vitamin D deficiency disease 02/24/2019   Past Surgical History:  Procedure Laterality Date  . COLONOSCOPY N/A 03/10/2018   Procedure: COLONOSCOPY;  Surgeon: Daneil Dolin, MD;  [facility];  Service: Endoscopy;  Laterality: N/A;  7:30  . KNEE ARTHROSCOPY WITH MEDIAL MENISECTOMY Right 01/21/2020   Procedure: KNEE ARTHROSCOPY WITH MEDIAL AND LATERAL MENISECTOMY;  Surgeon: Carole Civil, MD;  [facility]: [facility];  Service: Orthopedics;  Laterality: Right;  . KNEE SURGERY Right 1987  . POLYPECTOMY  03/10/2018   Procedure: POLYPECTOMY;  Surgeon: Daneil Dolin, MD;  [facility];  Service: Endoscopy;;  (colon)  . Testicle removed Left   . VASECTOMY  1989     Family History  Problem Relation Age of Onset  . Hypertension Mother   . Diabetes Mother   . Heart disease Father      Social History   Social History Narrative   Married for 44 years.Lives with wife.Retired August 2020.   Social History   Tobacco Use  . Smoking status: Never Smoker  . Smokeless tobacco: Never Used  Substance Use Topics  . Alcohol use: Yes    Comment: Occasional    Current Meds  Medication Sig  . Ascorbic Acid (VITAMIN C) 1000 MG tablet Take 1,000 mg by mouth daily.  Marland Kitchen aspirin EC 81 MG tablet Take 81 mg by mouth daily.  . Calcium Polycarbophil (FIBER-CAPS PO) Take 5 capsules by mouth daily.  . Cholecalciferol (VITAMIN D3) 125 MCG (5000 UT) TABS Take 10,000 Units by mouth daily.   . Glucosamine-Chondroit-Vit C-Mn (GLUCOSAMINE 1500 COMPLEX PO) Take 2 tablets by mouth daily.  Marland Kitchen OVER THE COUNTER MEDICATION Take 1 tablet by mouth daily. ketogenic weight loss complex  . Rhodiola rosea (RHODIOLA PO) Take 1 capsule by mouth daily.  . Testosterone 20 % CREA Apply 100 mg topically 2 (two) times daily.      Depression screen [facility] 2/9 05/09/2020 09/02/2019  Decreased Interest 0 0  Down, Depressed, Hopeless 0 0  PHQ - 2 Score 0 0  Altered sleeping 0 -  Tired, decreased energy 0 -  Change in appetite 0 -  Feeling bad or failure about yourself  0 -  Trouble concentrating 0 -  Moving slowly or fidgety/restless 0 -  Suicidal thoughts 0 -  PHQ-9 Score 0 -  Difficult doing work/chores Not difficult at all -     Objective:   Today's Vitals: BP 124/76   Pulse 76   Temp (!) 97.3 F (36.3 C) (Temporal)   Ht 6\' 3"  (1.905 m)   Wt (!) 306 lb 12.8 oz (139.2 kg)   SpO2 96%   BMI 38.35 kg/m  Vitals with BMI 05/09/2020 04/03/2020 03/15/2020  Height 6\' 3"  6\' 3"  6\' 3"   Weight 306 lbs 13 oz 308 lbs 308 lbs 6 oz  BMI 38.35 97.9 89.21  Systolic 194 - 174  Diastolic 76 - 82  Pulse 76 - 89     Physical Exam  He does not appear to be in acute pain.  There is no spinal bony tenderness.  There is no significant muscular spasm in the right paramedian area which is where he describes the  pain.     Assessment   1. Acute right-sided low back pain without sciatica       Tests ordered No orders of the defined types were placed in this encounter.    Plan: 1. I suspect he has muscular sprain/spasm.  I recommended back stretching exercises and I demonstrated a couple of these exercises to him.  I offered muscle relaxants but he did not want to do this at the time.  He will try ibuprofen only when required and anticipate that he should have a full recovery.  I did tell him that if he gets other symptoms such as hematuria, nausea or vomiting, then I definitely need to be aware.   No orders of the defined types were placed in this encounter.   Doree Albee, MD

## 2020-06-14 ENCOUNTER — Other Ambulatory Visit: Payer: Self-pay

## 2020-06-14 ENCOUNTER — Ambulatory Visit (INDEPENDENT_AMBULATORY_CARE_PROVIDER_SITE_OTHER): Payer: Medicare HMO | Admitting: Internal Medicine

## 2020-06-14 ENCOUNTER — Encounter (INDEPENDENT_AMBULATORY_CARE_PROVIDER_SITE_OTHER): Payer: Self-pay | Admitting: Internal Medicine

## 2020-06-14 VITALS — BP 142/80 | HR 84 | Temp 97.2°F | Resp 18 | Ht 75.0 in | Wt 308.0 lb

## 2020-06-14 DIAGNOSIS — E291 Testicular hypofunction: Secondary | ICD-10-CM

## 2020-06-14 DIAGNOSIS — E669 Obesity, unspecified: Secondary | ICD-10-CM | POA: Diagnosis not present

## 2020-06-14 NOTE — Progress Notes (Signed)
Metrics: Intervention Frequency ACO  Documented Smoking Status Yearly  Screened one or more times in 24 months  Cessation Counseling or  Active cessation medication Past 24 months  Past 24 months   Guideline developer: UpToDate (See UpToDate for funding source) Date Released: 2014       Wellness Office Visit  Subjective:  Patient ID: Harold West, male    DOB: [birth year]  Age: 70 y.o. MRN: [id number]  CC: This man comes in for follow-up of hypogonadism and obesity. HPI  He is doing reasonably well.  The last time I saw him for testosterone therapy, we increase the dose and he now tells me that he is getting more early morning erections now.  His energy levels are somewhat improved.  He has also been taking DHEA 50 mg daily. His back pain that he saw me for recently has improved completely. Past Medical History:  Diagnosis Date  . Arthritis   . Dysrhythmia    history of AFIB  . Obesity (BMI 30-39.9) 02/24/2019  . Testicular failure 02/24/2019  . Vitamin D deficiency disease 02/24/2019   Past Surgical History:  Procedure Laterality Date  . COLONOSCOPY N/A 03/10/2018   Procedure: COLONOSCOPY;  Surgeon: Daneil Dolin, MD;  [facility];  Service: Endoscopy;  Laterality: N/A;  7:30  . KNEE ARTHROSCOPY WITH MEDIAL MENISECTOMY Right 01/21/2020   Procedure: KNEE ARTHROSCOPY WITH MEDIAL AND LATERAL MENISECTOMY;  Surgeon: Carole Civil, MD;  [facility]: [facility];  Service: Orthopedics;  Laterality: Right;  . KNEE SURGERY Right 1987  . POLYPECTOMY  03/10/2018   Procedure: POLYPECTOMY;  Surgeon: Daneil Dolin, MD;  [facility];  Service: Endoscopy;;  (colon)  . Testicle removed Left   . VASECTOMY  1989     Family History  Problem Relation Age of Onset  . Hypertension Mother   . Diabetes Mother   . Heart disease Father     Social History   Social History Narrative   Married for 44 years.Lives with wife.Retired August 2020.   Social History    Tobacco Use  . Smoking status: Never Smoker  . Smokeless tobacco: Never Used  Substance Use Topics  . Alcohol use: Yes    Comment: Occasional    Current Meds  Medication Sig  . Ascorbic Acid (VITAMIN C) 1000 MG tablet Take 1,000 mg by mouth daily.  Marland Kitchen aspirin EC 81 MG tablet Take 81 mg by mouth daily.  . Calcium Polycarbophil (FIBER-CAPS PO) Take 5 capsules by mouth daily.  . Cholecalciferol (VITAMIN D3) 125 MCG (5000 UT) TABS Take 10,000 Units by mouth daily.   Marland Kitchen DHEA 50 MG CAPS Take 50 mg by mouth daily at 12 noon.  . Glucosamine-Chondroit-Vit C-Mn (GLUCOSAMINE 1500 COMPLEX PO) Take 2 tablets by mouth daily.  Marland Kitchen OVER THE COUNTER MEDICATION Take 1 tablet by mouth daily. ketogenic weight loss complex  . Rhodiola rosea (RHODIOLA PO) Take 1 capsule by mouth daily.  . Testosterone 20 % CREA Apply 100 mg topically 2 (two) times daily.  . [DISCONTINUED] testosterone cypionate (DEPOTESTOSTERONE CYPIONATE) 200 MG/ML injection INJECT 0.5 ML INTO THE MUSCLE TWICE A WEEK     Flowsheet Row Office Visit from 05/09/2020 in [facility]  PHQ-9 Total Score 0      Objective:   Today's Vitals: BP (!) 142/80 (BP Location: Right Arm, Patient Position: Sitting, Cuff Size: Large)   Pulse 84   Temp (!) 97.2 F (36.2 C) (Temporal)   Resp 18  Ht 6\' 3"  (1.905 m)   Wt (!) 308 lb (139.7 kg)   SpO2 98%   BMI 38.50 kg/m  Vitals with BMI 06/14/2020 05/09/2020 04/03/2020  Height 6\' 3"  6\' 3"  6\' 3"   Weight 308 lbs 306 lbs 13 oz 308 lbs  BMI 38.5 23.30 07.6  Systolic 226 333 -  Diastolic 80 76 -  Pulse 84 76 -     Physical Exam   He looks systemically well, remains obese.  Blood pressure acceptable for his age.    Assessment   1. Testicular failure   2. Obesity (BMI 30-39.9)       Tests ordered Orders Placed This Encounter  Procedures  . Testosterone Total,Free,Bio, Males  . DHEA-sulfate     Plan: 1. He will continue with testosterone therapy 100 mg cream twice a day and  also DHEA 50 mg daily.  We will check both levels today. 2. Follow-up in 3 months.   No orders of the defined types were placed in this encounter.   Doree Albee, MD

## 2020-06-15 LAB — TESTOSTERONE TOTAL,FREE,BIO, MALES
Albumin: 4.4 g/dL (ref 3.6–5.1)
Sex Hormone Binding: 26 nmol/L (ref 22–77)
Testosterone, Bioavailable: 186 ng/dL (ref >=110.0)
Testosterone, Free: 92.4 pg/mL (ref 46.0–224.0)
Testosterone: 550 ng/dL (ref 250–827)

## 2020-06-15 LAB — DHEA-SULFATE: DHEA-SO4: 608 ug/dL — ABNORMAL HIGH (ref 20–217)

## 2020-07-05 ENCOUNTER — Encounter (INDEPENDENT_AMBULATORY_CARE_PROVIDER_SITE_OTHER): Payer: Self-pay | Admitting: Internal Medicine

## 2020-08-03 ENCOUNTER — Telehealth (INDEPENDENT_AMBULATORY_CARE_PROVIDER_SITE_OTHER): Payer: Self-pay | Admitting: Internal Medicine

## 2020-08-03 NOTE — Telephone Encounter (Signed)
Left message for patient to call back and schedule Medicare Annual Wellness Visit (AWV) either virtually or in office.   awvi 12/08/18 per [facility]   please schedule at anytime with  health coach  This should be a 40 minute visit.

## 2020-08-08 DIAGNOSIS — Z712 Person consulting for explanation of examination or test findings: Secondary | ICD-10-CM | POA: Diagnosis not present

## 2020-09-11 ENCOUNTER — Telehealth (INDEPENDENT_AMBULATORY_CARE_PROVIDER_SITE_OTHER): Payer: Self-pay

## 2020-09-11 ENCOUNTER — Other Ambulatory Visit (INDEPENDENT_AMBULATORY_CARE_PROVIDER_SITE_OTHER): Payer: Self-pay | Admitting: Internal Medicine

## 2020-09-11 MED ORDER — TESTOSTERONE 20 % CREA
100.0000 mg | TOPICAL_CREAM | Freq: Two times a day (BID) | 3 refills | Status: DC
Start: 2020-09-11 — End: 2020-10-26

## 2020-09-11 NOTE — Telephone Encounter (Signed)
Patient called and stated that he needs a refill of the following medication sent to [facility]:  Testosterone 20 % CREA  Last filled 03/15/2020, # 100 g with 0 refills  Last OV 06/14/2020  Next OV 09/20/2020

## 2020-09-13 ENCOUNTER — Encounter: Payer: Self-pay | Admitting: Physician Assistant

## 2020-09-13 ENCOUNTER — Ambulatory Visit: Payer: Medicare HMO | Admitting: Physician Assistant

## 2020-09-13 ENCOUNTER — Other Ambulatory Visit: Payer: Self-pay

## 2020-09-13 DIAGNOSIS — Z1283 Encounter for screening for malignant neoplasm of skin: Secondary | ICD-10-CM

## 2020-09-13 DIAGNOSIS — L570 Actinic keratosis: Secondary | ICD-10-CM

## 2020-09-13 DIAGNOSIS — L821 Other seborrheic keratosis: Secondary | ICD-10-CM | POA: Diagnosis not present

## 2020-09-13 DIAGNOSIS — C44619 Basal cell carcinoma of skin of left upper limb, including shoulder: Secondary | ICD-10-CM

## 2020-09-13 DIAGNOSIS — D485 Neoplasm of uncertain behavior of skin: Secondary | ICD-10-CM | POA: Diagnosis not present

## 2020-09-13 DIAGNOSIS — L986 Other infiltrative disorders of the skin and subcutaneous tissue: Secondary | ICD-10-CM | POA: Diagnosis not present

## 2020-09-13 NOTE — Progress Notes (Signed)
New Patient   Subjective  Harold West is a 70 y.o. male who presents for the following: Annual Exam (New lesion behind right ear x 6 months- + itch no bleed. No personal or family history of melanoma or non mole skin cancers).   The following portions of the chart were reviewed this encounter and updated as appropriate:  Tobacco  Allergies  Meds  Problems  Med Hx  Surg Hx  Fam Hx      Objective  Well appearing patient in no apparent distress; mood and affect are within normal limits.  A full examination was performed including scalp, head, eyes, ears, nose, lips, neck, chest, axillae, abdomen, back, buttocks, bilateral upper extremities, bilateral lower extremities, hands, feet, fingers, toes, fingernails, and toenails. All findings within normal limits unless otherwise noted below.  Objective  Left Mid Upper Back: Pearly papule with telangectasia.        Objective  Left Upper Arm - Anterior: Pearly papule with telangectasia.        Objective  Right Upper Posterior Neck: Pearly papule with telangectasia.        Objective  Left Dorsal Hand, Right Superior Helix: Erythematous patches with gritty scale.  Objective  Left Lower Leg - Posterior: Brownish/pink plaque  Images     Assessment & Plan  Neoplasm of uncertain behavior of skin (3) Left Mid Upper Back  Skin / nail biopsy Type of biopsy: tangential   Informed consent: discussed and consent obtained   Timeout: patient name, date of birth, surgical site, and procedure verified   Procedure prep:  Patient was prepped and draped in usual sterile fashion (Non sterile) Prep type:  Chlorhexidine Anesthesia: the lesion was anesthetized in a standard fashion   Anesthetic:  1% lidocaine w/ epinephrine 1-100,000 local infiltration Instrument used: flexible razor blade   Hemostasis achieved with: ferric subsulfate and electrodesiccation   Outcome: patient tolerated procedure well   Post-procedure  details: sterile dressing applied and wound care instructions given   Dressing type: bandage and petrolatum    Specimen 1 - Surgical pathology Differential Diagnosis: R/O BCC vs SCC - cautery  Check Margins: No  Left Upper Arm - Anterior  Skin / nail biopsy Type of biopsy: tangential   Informed consent: discussed and consent obtained   Timeout: patient name, date of birth, surgical site, and procedure verified   Procedure prep:  Patient was prepped and draped in usual sterile fashion (Non sterile) Prep type:  Chlorhexidine Anesthesia: the lesion was anesthetized in a standard fashion   Anesthetic:  1% lidocaine w/ epinephrine 1-100,000 local infiltration Instrument used: flexible razor blade   Hemostasis achieved with: ferric subsulfate and electrodesiccation   Outcome: patient tolerated procedure well   Post-procedure details: sterile dressing applied and wound care instructions given   Dressing type: bandage and petrolatum    Specimen 2 - Surgical pathology Differential Diagnosis: R/O BCC vs SCC - cautery  Check Margins: No  Right Upper Posterior Neck  Skin / nail biopsy Type of biopsy: tangential   Informed consent: discussed and consent obtained   Timeout: patient name, date of birth, surgical site, and procedure verified   Procedure prep:  Patient was prepped and draped in usual sterile fashion (Non sterile) Prep type:  Chlorhexidine Anesthesia: the lesion was anesthetized in a standard fashion   Anesthetic:  1% lidocaine w/ epinephrine 1-100,000 local infiltration Instrument used: flexible razor blade   Hemostasis achieved with: ferric subsulfate   Outcome: patient tolerated procedure well  Post-procedure details: wound care instructions given    Specimen 3 - Surgical pathology Differential Diagnosis: R/O BCC vs SCC  Check Margins: Yes  AK (actinic keratosis) (2) Left Dorsal Hand; Right Superior Helix  Destruction of lesion - Left Dorsal Hand, Right Superior  Helix Complexity: simple   Destruction method: cryotherapy   Informed consent: discussed and consent obtained   Timeout:  patient name, date of birth, surgical site, and procedure verified Lesion destroyed using liquid nitrogen: Yes   Cryotherapy cycles:  1 Outcome: patient tolerated procedure well with no complications   Post-procedure details: wound care instructions given    Skin exam for malignant neoplasm Left Lower Leg - Posterior  Observe      I, Na Waldrip, PA-C, have reviewed all documentation for this visit. The documentation on 09/13/20 for the exam, diagnosis, procedures, and orders are all accurate and complete.

## 2020-09-13 NOTE — Patient Instructions (Signed)

## 2020-09-20 ENCOUNTER — Telehealth: Payer: Self-pay

## 2020-09-20 ENCOUNTER — Ambulatory Visit (INDEPENDENT_AMBULATORY_CARE_PROVIDER_SITE_OTHER): Payer: Medicare HMO | Admitting: Internal Medicine

## 2020-09-20 NOTE — Telephone Encounter (Signed)
Patient aware of results and surgery made

## 2020-09-20 NOTE — Telephone Encounter (Signed)
-----   Message from Warren Danes, [place] sent at 09/19/2020  2:51 PM EDT ----- L upper arm 30 ----- Message ----- From: Interface, [facility] In [facility] [place] [facility] Sent: 09/15/2020   6:39 PM EDT To: Warren Danes, PA-C

## 2020-10-26 ENCOUNTER — Ambulatory Visit (INDEPENDENT_AMBULATORY_CARE_PROVIDER_SITE_OTHER): Payer: Medicare HMO | Admitting: Internal Medicine

## 2020-10-26 ENCOUNTER — Encounter (INDEPENDENT_AMBULATORY_CARE_PROVIDER_SITE_OTHER): Payer: Self-pay | Admitting: Internal Medicine

## 2020-10-26 ENCOUNTER — Other Ambulatory Visit: Payer: Self-pay

## 2020-10-26 VITALS — BP 122/74 | HR 85 | Temp 96.9°F | Ht 75.0 in | Wt 322.0 lb

## 2020-10-26 DIAGNOSIS — E291 Testicular hypofunction: Secondary | ICD-10-CM | POA: Diagnosis not present

## 2020-10-26 DIAGNOSIS — E669 Obesity, unspecified: Secondary | ICD-10-CM

## 2020-10-26 MED ORDER — CIPROFLOXACIN HCL 500 MG PO TABS: 500.0000 mg | ORAL_TABLET | Freq: Two times a day (BID) | ORAL | 0 refills | Status: AC

## 2020-10-26 MED ORDER — TESTOSTERONE 20 % CREA
100.0000 mg | TOPICAL_CREAM | Freq: Two times a day (BID) | 3 refills | Status: DC
Start: 2020-10-26 — End: 2021-06-14

## 2020-10-26 NOTE — Progress Notes (Signed)
Metrics: Intervention Frequency ACO  Documented Smoking Status Yearly  Screened one or more times in 24 months  Cessation Counseling or  Active cessation medication Past 24 months  Past 24 months   Guideline developer: UpToDate (See UpToDate for funding source) Date Released: 2014       Wellness Office Visit  Subjective:  Patient ID: Harold West, male    DOB: 1951/01/09  Age: 70 y.o. MRN: [id number]  CC: This man comes in for follow-up of hypogonadism and obesity. HPI  He is doing reasonably well but he has gained weight. Past Medical History:  Diagnosis Date   Arthritis    Dysrhythmia    history of AFIB   Obesity (BMI 30-39.9) 02/24/2019   Testicular failure 02/24/2019   Vitamin D deficiency disease 02/24/2019   Past Surgical History:  Procedure Laterality Date   COLONOSCOPY N/A 03/10/2018   Procedure: COLONOSCOPY;  Surgeon: Daneil Dolin, MD;  [facility];  Service: Endoscopy;  Laterality: N/A;  7:30   KNEE ARTHROSCOPY WITH MEDIAL MENISECTOMY Right 01/21/2020   Procedure: KNEE ARTHROSCOPY WITH MEDIAL AND LATERAL MENISECTOMY;  Surgeon: Carole Civil, MD;  [facility];  Service: Orthopedics;  Laterality: Right;   KNEE SURGERY Right 1987   POLYPECTOMY  03/10/2018   Procedure: POLYPECTOMY;  Surgeon: Daneil Dolin, MD;  [facility];  Service: Endoscopy;;  (colon)   Testicle removed Left    VASECTOMY  1989     Family History  Problem Relation Age of Onset   Hypertension Mother    Diabetes Mother    Heart disease Father     Social History   Social History Narrative   Married for 44 years.Lives with wife.Retired August 2020.   Social History   Tobacco Use   Smoking status: Never   Smokeless tobacco: Never  Substance Use Topics   Alcohol use: Yes    Comment: Occasional    Current Meds  Medication Sig   Ascorbic Acid (VITAMIN C) 1000 MG tablet Take 1,000 mg by mouth daily.   aspirin EC 81 MG tablet Take 81 mg by mouth  daily.   Calcium Polycarbophil (FIBER-CAPS PO) Take 5 capsules by mouth daily.   Cholecalciferol (VITAMIN D3) 125 MCG (5000 UT) TABS Take 10,000 Units by mouth daily.    ciprofloxacin (CIPRO) 500 MG tablet Take 1 tablet (500 mg total) by mouth 2 (two) times daily for 10 days.   DHEA 50 MG CAPS Take 50 mg by mouth daily at 12 noon.   Glucosamine-Chondroit-Vit C-Mn (GLUCOSAMINE 1500 COMPLEX PO) Take 2 tablets by mouth daily.   OVER THE COUNTER MEDICATION Take 1 tablet by mouth daily. ketogenic weight loss complex   Rhodiola rosea (RHODIOLA PO) Take 1 capsule by mouth daily.   [DISCONTINUED] Testosterone 20 % CREA Apply 100 mg topically 2 (two) times daily.     [place] Office Visit from 05/09/2020 in [facility]  PHQ-9 Total Score 0       Objective:   Today's Vitals: BP 122/74   Pulse 85   Temp (!) 96.9 F (36.1 C) (Temporal)   Ht 6\' 3"  (1.905 m)   Wt (!) 322 lb (146.1 kg)   SpO2 95%   BMI 40.25 kg/m  Vitals with BMI 10/26/2020 06/14/2020 05/09/2020  Height 6\' 3"  6\' 3"  6\' 3"   Weight 322 lbs 308 lbs 306 lbs 13 oz  BMI 40.25 42.7 06.23  Systolic 762 831 517  Diastolic 74 80 76  Pulse  85 84 76     Physical Exam  Remains morbidly obese.  Blood pressure is in a good range.     Assessment   1. Testicular failure   2. Obesity (BMI 30-39.9)       Tests ordered No orders of the defined types were placed in this encounter.    Plan: 1.  Continue with testosterone cream as before.  I will send in a refill.  2.  I will see him in January for an annual physical exam and we will do all the blood work then.   Meds ordered this encounter  Medications   ciprofloxacin (CIPRO) 500 MG tablet    Sig: Take 1 tablet (500 mg total) by mouth 2 (two) times daily for 10 days.    Dispense:  20 tablet    Refill:  0   Testosterone 20 % CREA    Sig: Apply 100 mg topically 2 (two) times daily.    Dispense:  30 g    Refill:  3     Chae Shuster Luther Parody, MD

## 2020-10-30 ENCOUNTER — Ambulatory Visit (INDEPENDENT_AMBULATORY_CARE_PROVIDER_SITE_OTHER): Payer: Medicare HMO | Admitting: Internal Medicine

## 2020-11-10 ENCOUNTER — Other Ambulatory Visit: Payer: Self-pay

## 2020-11-10 ENCOUNTER — Ambulatory Visit: Payer: Medicare HMO | Admitting: Podiatry

## 2020-11-10 DIAGNOSIS — M216X2 Other acquired deformities of left foot: Secondary | ICD-10-CM

## 2020-11-10 DIAGNOSIS — Q828 Other specified congenital malformations of skin: Secondary | ICD-10-CM

## 2020-11-14 ENCOUNTER — Encounter: Payer: Self-pay | Admitting: Podiatry

## 2020-11-14 NOTE — Progress Notes (Signed)
Subjective:  Patient ID: Harold West, male    DOB: Feb 01, 1951,  MRN: [id number]  Chief Complaint  Patient presents with   Callouses    Callus     70 y.o. male presents with the above complaint.  Patient presents with complaint left submetatarsal 1 hyperkeratotic lesion.  Patient states is painful to touch.  He has a history of bunion surgery that was done on the left side.  Patient states that he does not want any surgeries.  He does not want any x-rays.  He would like to be active he would like to discuss treatment options for this.  He denies any other acute complaints.  He would like to have it debrided down.   Review of Systems: Negative except as noted in the HPI. Denies N/V/F/Ch.  Past Medical History:  Diagnosis Date   Arthritis    Dysrhythmia    history of AFIB   Obesity (BMI 30-39.9) 02/24/2019   Testicular failure 02/24/2019   Vitamin D deficiency disease 02/24/2019    Current Outpatient Medications:    acetaZOLAMIDE (DIAMOX) 250 MG tablet, Take 250 mg by mouth daily. (Patient not taking: Reported on 10/26/2020), Disp: , Rfl:    Ascorbic Acid (VITAMIN C) 1000 MG tablet, Take 1,000 mg by mouth daily., Disp: , Rfl:    aspirin EC 81 MG tablet, Take 81 mg by mouth daily., Disp: , Rfl:    azithromycin (ZITHROMAX) 250 MG tablet, Take by mouth as directed. (Patient not taking: Reported on 10/26/2020), Disp: , Rfl:    Calcium Polycarbophil (FIBER-CAPS PO), Take 5 capsules by mouth daily., Disp: , Rfl:    Cholecalciferol (VITAMIN D3) 125 MCG (5000 UT) TABS, Take 10,000 Units by mouth daily. , Disp: , Rfl:    DHEA 50 MG CAPS, Take 50 mg by mouth daily at 12 noon., Disp: , Rfl:    Glucosamine-Chondroit-Vit C-Mn (GLUCOSAMINE 1500 COMPLEX PO), Take 2 tablets by mouth daily., Disp: , Rfl:    OVER THE COUNTER MEDICATION, Take 1 tablet by mouth daily. ketogenic weight loss complex, Disp: , Rfl:    Rhodiola rosea (RHODIOLA PO), Take 1 capsule by mouth daily., Disp: , Rfl:    Testosterone  20 % CREA, Apply 100 mg topically 2 (two) times daily., Disp: 30 g, Rfl: 3  Social History   Tobacco Use  Smoking Status Never  Smokeless Tobacco Never    Allergies  Allergen Reactions   Amiodarone Rash   Penicillins Rash    Has patient had a PCN reaction causing immediate rash, facial/tongue/throat swelling, SOB or lightheadedness with hypotension: Unknown Has patient had a PCN reaction causing severe rash involving mucus membranes or skin necrosis: No Has patient had a PCN reaction that required hospitalization: No Has patient had a PCN reaction occurring within the last 10 years: No If all of the above answers are "NO", then may proceed with Cephalosporin use.    Objective:  There were no vitals filed for this visit. There is no height or weight on file to calculate BMI. Constitutional Well developed. Well nourished.  Vascular Dorsalis pedis pulses palpable bilaterally. Posterior tibial pulses palpable bilaterally. Capillary refill normal to all digits.  No cyanosis or clubbing noted. Pedal hair growth normal.  Neurologic Normal speech. Oriented to person, place, and time. Epicritic sensation to light touch grossly present bilaterally.  Dermatologic Hyperkeratotic lesion without pinpoint bleeding noted upon debridement to the left submetatarsal 1.  Pain on palpation to the lesion.  Plantarflexed first metatarsal noted.  Hallux limitus/rigidus  noted to the first metatarsophalangeal joint.  No pain on palpation to the joint  Orthopedic: Normal joint ROM without pain or crepitus bilaterally. No visible deformities. No bony tenderness.   Radiographs: None Assessment:   1. Porokeratosis   2. Plantar flexed metatarsal bone of left foot    Plan:  Patient was evaluated and treated and all questions answered.  Left submetatarsal 1 benign skin lesion with underlying plantarflexed first met -I explained to the patient the etiology of skin lesion and various treatment options  were extensively discussed.  Given the amount of pain that is having I believe he will benefit from debridement of the lesion.  Using chisel blade and handle the lesion was debrided down to healthy striated tissue.  No pinpoint bleeding noted no central nucleated core noted.  I discussed with him given that he has a history of bunion surgery this is likely attributed to the underlying plantarflexed metatarsal leading to excessive pressure to the submetatarsal 1 and the sesamoidal complex. -I discussed offloading as well as custom orthotics.  Patient would like to think about the options.  No follow-ups on file.

## 2020-12-07 DIAGNOSIS — Z20822 Contact with and (suspected) exposure to covid-19: Secondary | ICD-10-CM | POA: Diagnosis not present

## 2020-12-28 ENCOUNTER — Other Ambulatory Visit: Payer: Self-pay

## 2020-12-28 ENCOUNTER — Ambulatory Visit (INDEPENDENT_AMBULATORY_CARE_PROVIDER_SITE_OTHER): Payer: Medicare HMO | Admitting: Physician Assistant

## 2020-12-28 ENCOUNTER — Encounter: Payer: Self-pay | Admitting: Physician Assistant

## 2020-12-28 DIAGNOSIS — C44619 Basal cell carcinoma of skin of left upper limb, including shoulder: Secondary | ICD-10-CM

## 2020-12-28 NOTE — Progress Notes (Signed)
   Follow-Up Visit   Subjective  Harold West is a 70 y.o. male who presents for the following: Procedure (Patient here today for treatment of BCC x 1 left upper arm). Healed well.   The following portions of the chart were reviewed this encounter and updated as appropriate:  Tobacco  Allergies  Meds  Problems  Med Hx  Surg Hx  Fam Hx      Objective  Well appearing patient in no apparent distress; mood and affect are within normal limits.  A focused examination was performed including upper arm. Relevant physical exam findings are noted in the Assessment and Plan.  Left Upper Arm - Anterior Pink macule     [id number]  Assessment & Plan  Basal cell carcinoma of skin of left upper limb, including shoulder Left Upper Arm - Anterior  Destruction of lesion Complexity: simple   Destruction method: electrodesiccation and curettage   Informed consent: discussed and consent obtained   Timeout:  patient name, date of birth, surgical site, and procedure verified Anesthesia: the lesion was anesthetized in a standard fashion   Anesthetic:  1% lidocaine w/ epinephrine 1-100,000 local infiltration Curettage performed in three different directions: Yes   Electrodesiccation performed over the curetted area: Yes   Curettage cycles:  3 Final wound size (cm):  2.6 Hemostasis achieved with:  ferric subsulfate Outcome: patient tolerated procedure well with no complications   Post-procedure details: wound care instructions given   Additional details:  Wound innoculated with 5 fluorouracil solution.   I, Haralambos Yeatts, PA-C, have reviewed all documentation's for this visit.  The documentation on 12/28/20 for the exam, diagnosis, procedures and orders are all accurate and complete.

## 2020-12-28 NOTE — Patient Instructions (Signed)

## 2021-02-13 ENCOUNTER — Ambulatory Visit: Payer: Medicare HMO | Admitting: Physician Assistant

## 2021-04-11 ENCOUNTER — Ambulatory Visit
Admission: EM | Admit: 2021-04-11 | Discharge: 2021-04-11 | Disposition: A | Discharge status: Home / Self Care | Payer: Medicare HMO | Attending: Family Medicine | Admitting: Family Medicine

## 2021-04-11 ENCOUNTER — Other Ambulatory Visit: Payer: Self-pay

## 2021-04-11 DIAGNOSIS — J3089 Other allergic rhinitis: Secondary | ICD-10-CM | POA: Diagnosis not present

## 2021-04-11 DIAGNOSIS — J209 Acute bronchitis, unspecified: Secondary | ICD-10-CM

## 2021-04-11 DIAGNOSIS — Z20828 Contact with and (suspected) exposure to other viral communicable diseases: Secondary | ICD-10-CM | POA: Diagnosis not present

## 2021-04-11 MED ORDER — ALBUTEROL SULFATE HFA 108 (90 BASE) MCG/ACT IN AERS: 2.0000 | INHALATION_SPRAY | Freq: Four times a day (QID) | RESPIRATORY_TRACT | 0 refills | Status: DC | PRN

## 2021-04-11 MED ORDER — FLUTICASONE PROPIONATE 50 MCG/ACT NA SUSP: 1.0000 | Freq: Two times a day (BID) | NASAL | 2 refills | Status: DC

## 2021-04-11 MED ORDER — CETIRIZINE HCL 10 MG PO TABS: 10.0000 mg | ORAL_TABLET | Freq: Every day | ORAL | 2 refills | Status: DC

## 2021-04-11 MED ORDER — PROMETHAZINE-DM 6.25-15 MG/5ML PO SYRP: 5.0000 mL | ORAL_SOLUTION | Freq: Four times a day (QID) | ORAL | 0 refills | Status: DC | PRN

## 2021-04-11 MED ORDER — PREDNISONE 20 MG PO TABS: 40.0000 mg | ORAL_TABLET | Freq: Every day | ORAL | 0 refills | Status: DC

## 2021-04-11 NOTE — ED Provider Notes (Signed)
[facility]    CSN: [id number] Arrival date & time: 04/11/21  1322      History   Chief Complaint Chief Complaint  Patient presents with   Sore Throat    Cough, sore throat and ear ache    HPI Harold West is a 71 y.o. male.   Patient presenting today with several months of persistent hacking cough, off and on nasal congestion, sinus pressure, ear pressure, postnasal drip.  Thinks the symptoms initially started with a cold back in the fall, again got another cold-like illness around Christmas time.  States he has gotten significantly worse over the past few days with regard to the cough and has nighttime wheezing, mild chest tightness.  He is also having worsened sinus pressure, sneezing, ear pressure and popping.  He denies any history of seasonal allergies, asthma, smoking or COPD.  No new sick contacts recently.  Denies fever, chills, body aches, chest pain, abdominal pain, nausea vomiting or diarrhea.  So far trying Alka-Seltzer cold and flu, Mucinex with only mild relief.   Past Medical History:  Diagnosis Date   Arthritis    Dysrhythmia    history of AFIB   Obesity (BMI 30-39.9) 02/24/2019   Testicular failure 02/24/2019   Vitamin D deficiency disease 02/24/2019    Patient Active Problem List   Diagnosis Date Noted   S/P right knee arthroscopy 01/21/20 01/24/2020   Old bucket handle tear of lateral meniscus of right knee    Acute medial meniscus tear of right knee    Atrial fibrillation ([place]) 12/07/2019   Testicular failure 02/24/2019   Vitamin D deficiency disease 02/24/2019   Obesity (BMI 30-39.9) 02/24/2019    Past Surgical History:  Procedure Laterality Date   COLONOSCOPY N/A 03/10/2018   Procedure: COLONOSCOPY;  Surgeon: Daneil Dolin, MD;  Location: [facility];  Service: Endoscopy;  Laterality: N/A;  7:30   KNEE ARTHROSCOPY WITH MEDIAL MENISECTOMY Right 01/21/2020   Procedure: KNEE ARTHROSCOPY WITH MEDIAL AND LATERAL MENISECTOMY;  Surgeon:  Carole Civil, MD;  Location: [facility];  Service: Orthopedics;  Laterality: Right;   KNEE SURGERY Right 1987   POLYPECTOMY  03/10/2018   Procedure: POLYPECTOMY;  Surgeon: Daneil Dolin, MD;  Location: [facility];  Service: Endoscopy;;  (colon)   Testicle removed Left    VASECTOMY  1989       Home Medications    Prior to Admission medications   Medication Sig Start Date End Date Taking? Authorizing Provider  albuterol (VENTOLIN HFA) 108 (90 Base) MCG/ACT inhaler Inhale 2 puffs into the lungs every 6 (six) hours as needed for wheezing or shortness of breath. 04/11/21  Yes Volney American, PA-C  cetirizine (ZYRTEC ALLERGY) 10 MG tablet Take 1 tablet (10 mg total) by mouth daily. 04/11/21  Yes Volney American, PA-C  fluticasone [facility]) 50 MCG/ACT nasal spray Place 1 spray into both nostrils 2 (two) times daily. 04/11/21  Yes Volney American, PA-C  predniSONE (DELTASONE) 20 MG tablet Take 2 tablets (40 mg total) by mouth daily with breakfast. 04/11/21  Yes Volney American, PA-C  promethazine-dextromethorphan (PROMETHAZINE-DM) 6.25-15 MG/5ML syrup Take 5 mLs by mouth 4 (four) times daily as needed. 04/11/21  Yes Volney American, PA-C  Ascorbic Acid (VITAMIN C) 1000 MG tablet Take 1,000 mg by mouth daily.    [provider]  aspirin EC 81 MG tablet Take 81 mg by mouth daily.    [provider]  Calcium Polycarbophil (FIBER-CAPS  PO) Take 5 capsules by mouth daily.    [provider]  Cholecalciferol (VITAMIN D3) 125 MCG (5000 UT) TABS Take 10,000 Units by mouth daily.     [provider]  DHEA 50 MG CAPS Take 50 mg by mouth daily at 12 noon.    [provider]  Glucosamine-Chondroit-Vit C-Mn (GLUCOSAMINE 1500 COMPLEX PO) Take 2 tablets by mouth daily.    [provider]  MODERNA COVID-19 VACCINE 100 MCG/0.5ML injection  10/26/20   [provider]  OVER THE COUNTER MEDICATION Take 1 tablet by mouth  daily. ketogenic weight loss complex    [provider]  Rhodiola rosea (RHODIOLA PO) Take 1 capsule by mouth daily.    [provider]  Testosterone 20 % CREA Apply 100 mg topically 2 (two) times daily. 10/26/20   Doree Albee, MD  TYPHIM VI 25 MCG/0.5ML injection  08/08/20   [provider]  YF-VAX injection  08/08/20   [provider]    Family History Family History  Problem Relation Age of Onset   Hypertension Mother    Diabetes Mother    Heart disease Father     Social History Social History   Tobacco Use   Smoking status: Never   Smokeless tobacco: Never  Vaping Use   Vaping Use: Never used  Substance Use Topics   Alcohol use: Yes    Comment: Occasional   Drug use: Never     Allergies   Amiodarone and Penicillins   Review of Systems Review of Systems Per HPI  Physical Exam Triage Vital Signs ED Triage Vitals  Enc Vitals Group     BP 04/11/21 1406 124/80     Pulse --      Resp 04/11/21 1406 18     Temp 04/11/21 1406 98.5 F (36.9 C)     Temp Source 04/11/21 1406 Oral     SpO2 04/11/21 1406 94 %     Weight --      Height --      Head Circumference --      Peak Flow --      Pain Score 04/11/21 1401 3     Pain Loc --      Pain Edu? --      Excl. in [place]? --    No data found.  Updated Vital Signs BP 124/80 (BP Location: Right Arm)    Temp 98.5 F (36.9 C) (Oral)    Resp 18    SpO2 94%   Visual Acuity Right Eye Distance:   Left Eye Distance:   Bilateral Distance:    Right Eye Near:   Left Eye Near:    Bilateral Near:     Physical Exam Vitals and nursing note reviewed.  Constitutional:      Appearance: He is well-developed.  HENT:     Head: Atraumatic.     Right Ear: External ear normal.     Left Ear: External ear normal.     Nose: Rhinorrhea present.     Mouth/Throat:     Mouth: Mucous membranes are moist.     Pharynx: Posterior oropharyngeal erythema present. No oropharyngeal exudate.  Eyes:      Conjunctiva/sclera: Conjunctivae normal.     Pupils: Pupils are equal, round, and reactive to light.  Cardiovascular:     Rate and Rhythm: Normal rate and regular rhythm.  Pulmonary:     Effort: Pulmonary effort is normal. No respiratory distress.     Breath  sounds: Wheezing present. No rales.     Comments: Minimal scattered wheezes Musculoskeletal:        General: Normal range of motion.     Cervical back: Normal range of motion and neck supple.  Lymphadenopathy:     Cervical: No cervical adenopathy.  Skin:    General: Skin is warm and dry.  Neurological:     Mental Status: He is alert and oriented to person, place, and time.  Psychiatric:        Behavior: Behavior normal.     UC Treatments / Results  Labs (all labs ordered are listed, but only abnormal results are displayed) Labs Reviewed  COVID-19, FLU A+B NAA    EKG   Radiology No results found.  Procedures Procedures (including critical care time)  Medications Ordered in UC Medications - No data to display  Initial Impression / Assessment and Plan / UC Course  I have reviewed the triage vital signs and the nursing notes.  Pertinent labs & imaging results that were available during my care of the patient were reviewed by me and considered in my medical decision making (see chart for details).     Vital signs overall reassuring today, suspect postviral bronchitis with possibly some uncontrolled seasonal allergies underlying.  We will treat with prednisone, Phenergan DM, albuterol inhaler and start good allergy regimen with Zyrtec and Flonase.  Discussed supportive over-the-counter medications and home care additionally.  Return for acutely worsening symptoms.  Final Clinical Impressions(s) / UC Diagnoses   Final diagnoses:  Exposure to the flu  Acute bronchitis, unspecified organism  Seasonal allergic rhinitis due to other allergic trigger     Discharge Instructions      Try the Zyrtec and Flonase  consistently for the next few weeks to see if this controls the underlying and ongoing postnasal drainage, nasal congestion, sinus pressure, ear pressure.  You may take Sudafed or cold and congestion medication as needed additionally.  Complete the full course of prednisone and may take the albuterol inhaler and cough medicine as needed.    ED Prescriptions     Medication Sig Dispense Auth. Provider   cetirizine (ZYRTEC ALLERGY) 10 MG tablet Take 1 tablet (10 mg total) by mouth daily. 30 tablet Volney American, PA-C   fluticasone [facility]) 50 MCG/ACT nasal spray Place 1 spray into both nostrils 2 (two) times daily. 16 g Volney American, [place]   predniSONE (DELTASONE) 20 MG tablet Take 2 tablets (40 mg total) by mouth daily with breakfast. 10 tablet Volney American, PA-C   promethazine-dextromethorphan (PROMETHAZINE-DM) 6.25-15 MG/5ML syrup Take 5 mLs by mouth 4 (four) times daily as needed. 100 mL Volney American, PA-C   albuterol (VENTOLIN HFA) 108 (90 Base) MCG/ACT inhaler Inhale 2 puffs into the lungs every 6 (six) hours as needed for wheezing or shortness of breath. 18 g Volney American, [place]      PDMP not reviewed this encounter.   Volney American, [place] 04/11/21 1452

## 2021-04-11 NOTE — Discharge Instructions (Signed)
Try the Zyrtec and Flonase consistently for the next few weeks to see if this controls the underlying and ongoing postnasal drainage, nasal congestion, sinus pressure, ear pressure.  You may take Sudafed or cold and congestion medication as needed additionally.  Complete the full course of prednisone and may take the albuterol inhaler and cough medicine as needed.

## 2021-04-11 NOTE — ED Triage Notes (Signed)
Patient states that he has had a cough for a few months.   Patients wife states that right after Christmas his cough got worse with wheezing.   Patient states that his throat is hurting and the right ear.   Denies Fever

## 2021-04-12 LAB — COVID-19, FLU A+B NAA
Influenza A, NAA: NOT DETECTED
Influenza B, NAA: NOT DETECTED
SARS-CoV-2, NAA: NOT DETECTED

## 2021-04-13 IMAGING — MR MR KNEE*R* W/O CM
7 series · 40 of 40 positions shown · non-contrast
Comparison: None.

CLINICAL DATA: Right knee pain for 3 months. Injured 4 years ago.

EXAM:
MRI OF THE RIGHT KNEE WITHOUT CONTRAST
TECHNIQUE: Multiplanar, multisequence MR imaging of the knee was performed. No
intravenous contrast was administered.

[Series 8: T2 fat-sat · axial · right · 4.0mm · 0.44mm/px · z∈[-46,+75]mm · 7 of 26 slices shown (1 of 3)]
[im 1/26]
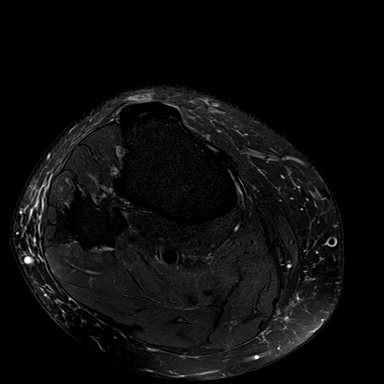
[im 5/26]
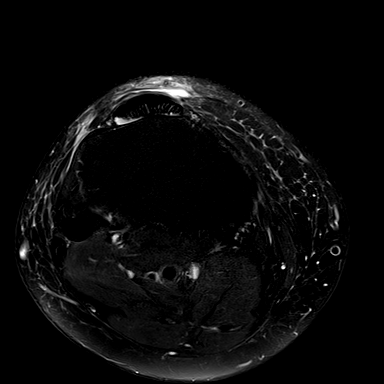
[im 9/26]
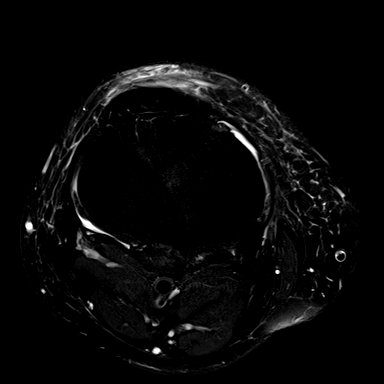
[im 13/26]
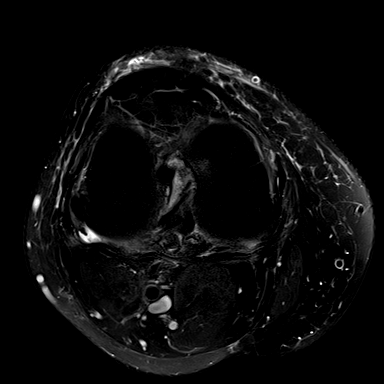
[im 17/26]
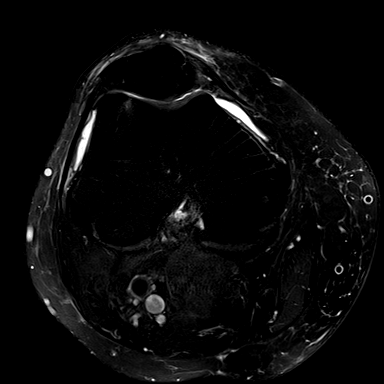
[im 21/26]
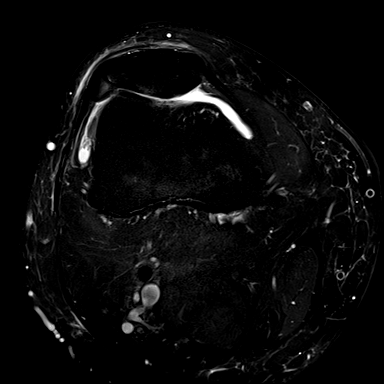
[im 26/26]
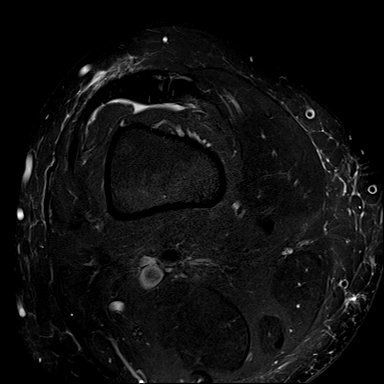

[Series 9: T1 · coronal · right · 4.0mm · 0.47mm/px · 6 of 30 slices shown]
[im 1/30]
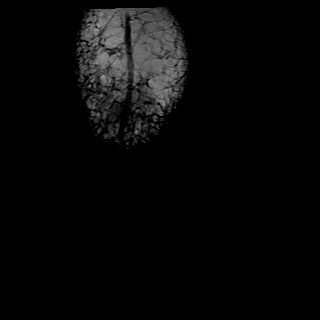
[im 6/30]
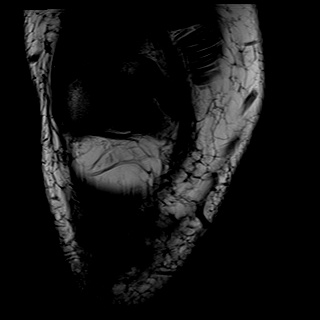
[im 12/30]
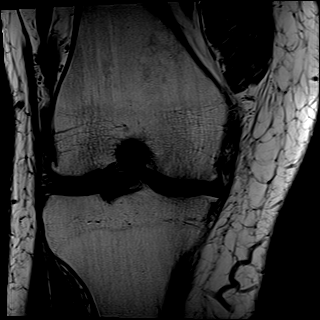
[im 18/30]
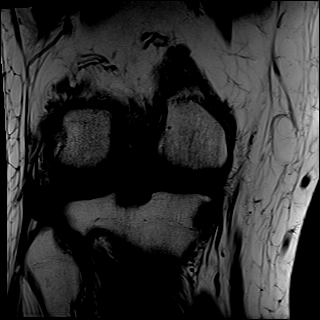
[im 24/30]
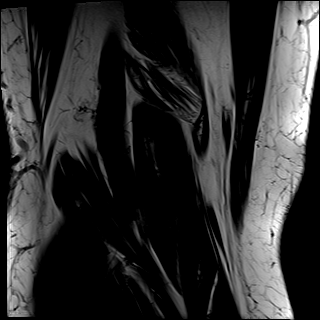
[im 30/30]
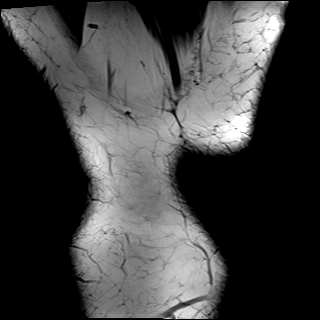

[Series 10: T2 fat-sat · coronal · right · 4.0mm · 0.47mm/px · 6 of 29 slices shown (2 of 3)]
[im 1/29]
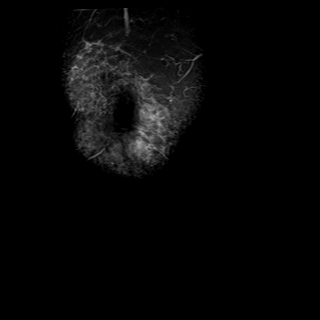
[im 6/29]
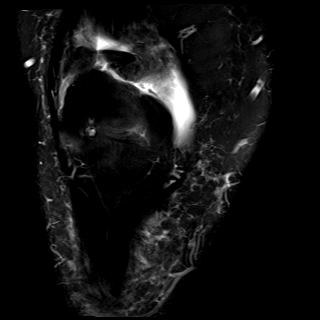
[im 12/29]
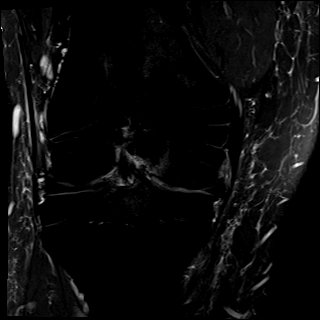
[im 17/29]
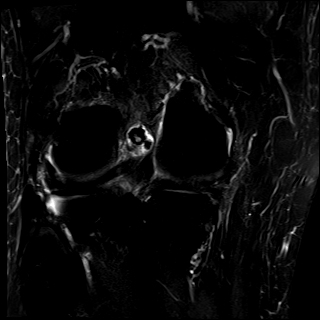
[im 23/29]
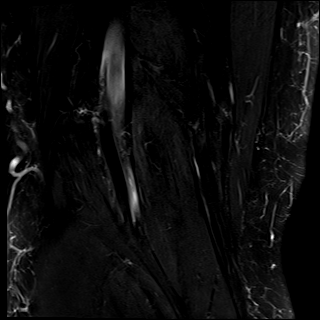
[im 29/29]
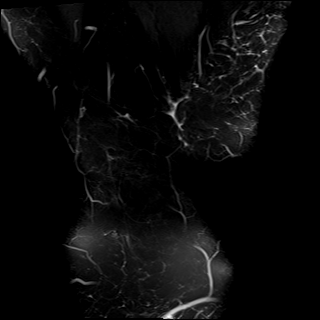

[Series 11: PD fat-sat · coronal · right · 4.0mm · 0.47mm/px · 6 of 29 slices shown (1 of 2)]
[im 1/29]
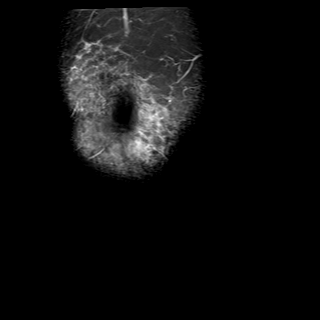
[im 6/29]
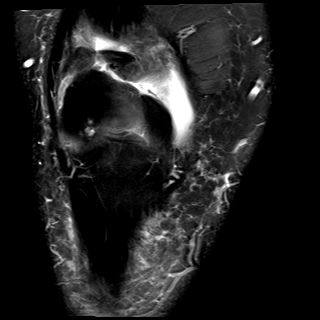
[im 12/29]
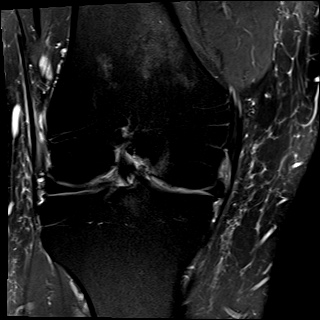
[im 17/29]
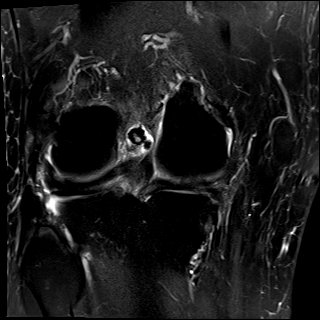
[im 23/29]
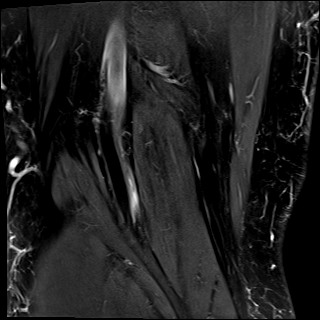
[im 29/29]
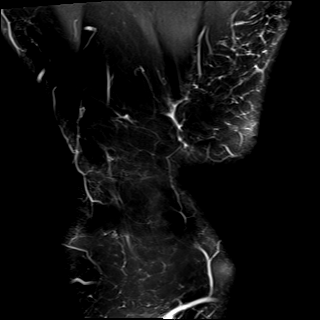

[Series 12: PD fat-sat · sagittal · right · 3.0mm · 0.53mm/px · 7 of 31 slices shown (2 of 2)]
[im 1/31]
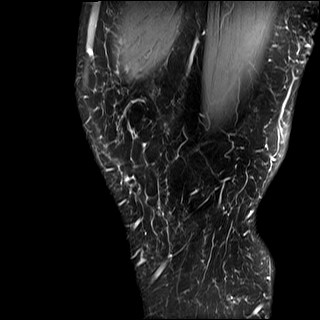
[im 6/31]
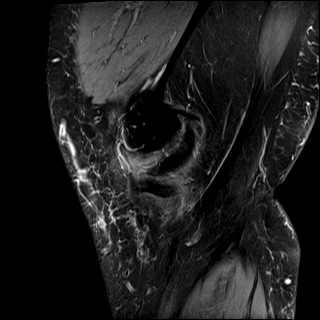
[im 11/31]
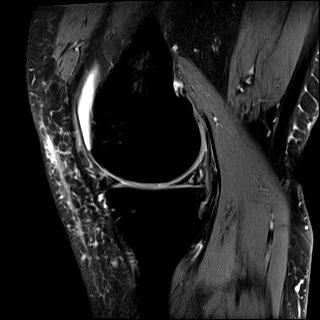
[im 16/31]
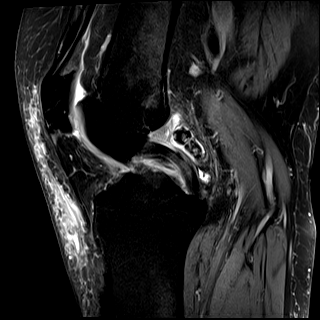
[im 21/31]
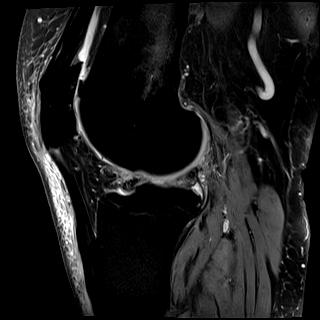
[im 26/31]
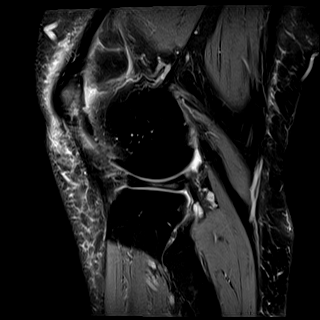
[im 31/31]
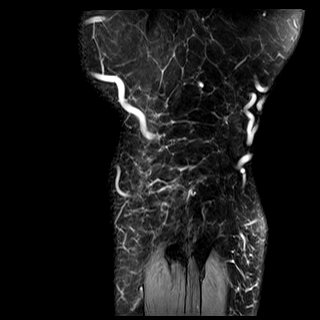

[Series 13: T2 fat-sat · sagittal · right · 3.0mm · 0.47mm/px · 6 of 30 slices shown (3 of 3)]
[im 1/30]
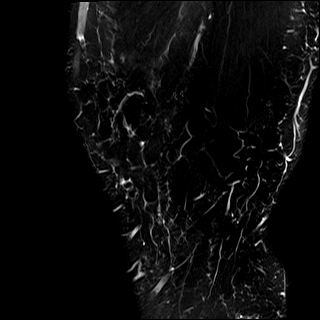
[im 6/30]
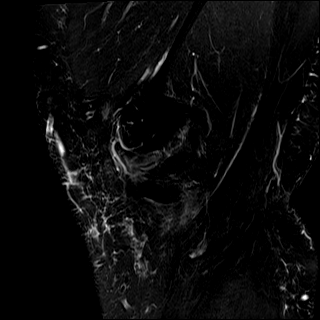
[im 12/30]
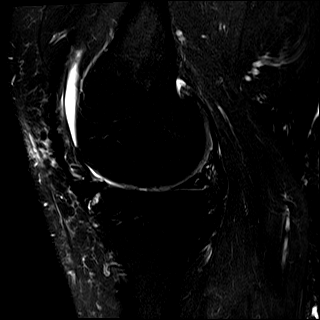
[im 18/30]
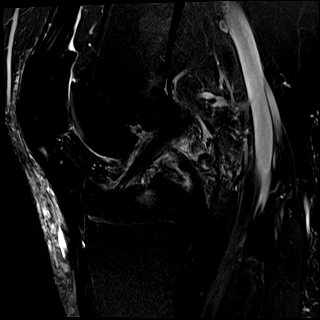
[im 24/30]
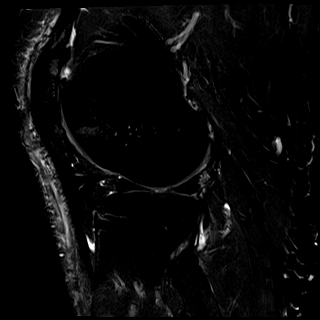
[im 30/30]
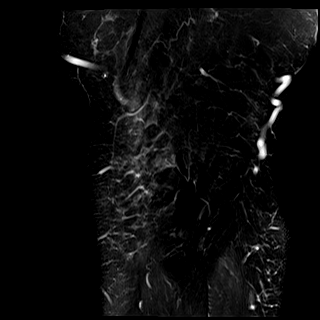

[Series 14: PD · coronal · right · 2.0mm · 0.47mm/px · 2 of 10 slices shown]
[im 1/10]
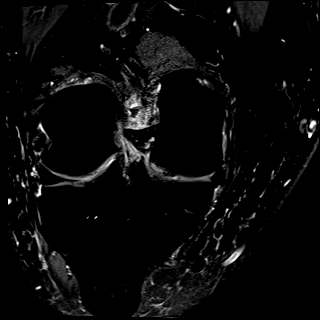
[im 10/10]
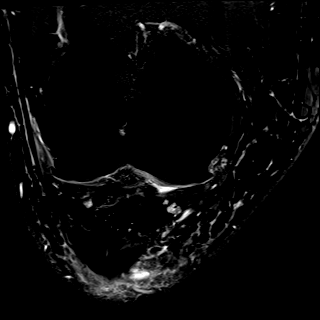

[40 of 40 positions shown; findings below may reference images not displayed]

FINDINGS: MENISCI

Medial: Complex tear of the posterior horn of the medial meniscus.
Small radial tear of the free edge of the body of the medial
meniscus.

Lateral: Partial radial tear of the root of the posterior horn of
the lateral meniscus.

LIGAMENTS

Cruciates: ACL and PCL are intact.

Collaterals: Medial collateral ligament is intact. Lateral
collateral ligament complex is intact.

CARTILAGE

Patellofemoral: High-grade partial-thickness cartilage loss with
areas of full-thickness cartilage loss of the lateral patellofemoral
compartment.

Medial: Partial-thickness cartilage loss of medial femorotibial
compartment.

Lateral: Mild partial-thickness cartilage loss of the lateral tibial
plateau.

JOINT: Small joint effusion. Normal Gerhard U Ingrid Micelli. No plical
thickening. Multiple loose bodies posterior to PCL measuring up to 8
mm. Small amount of fluid superficial to the patellar tendon likely
reflecting mild bursitis.

POPLITEAL FOSSA: Popliteus tendon is intact. No Baker's cyst.

EXTENSOR MECHANISM: Intact quadriceps tendon. Tear mild tendinosis
of the patellar tendon origin. Intact medial patellar retinaculum.
Intact lateral patellar retinaculum. Intact MPFL.

BONES: No marrow signal abnormality. No fracture or dislocation.

Other: No fluid collection or hematoma. Muscles are normal.
IMPRESSION: 1. Complex tear of the posterior horn of the medial meniscus. Small
radial tear of the free edge of the body of the medial meniscus.
2. Partial radial tear of the root of the posterior horn of the
lateral meniscus.
3. Tricompartmental cartilage abnormalities as described above.
4. Tear mild tendinosis of the patellar tendon origin.

## 2021-05-02 ENCOUNTER — Encounter (INDEPENDENT_AMBULATORY_CARE_PROVIDER_SITE_OTHER): Payer: Medicare HMO | Admitting: Internal Medicine

## 2021-05-30 DIAGNOSIS — H5200 Hypermetropia, unspecified eye: Secondary | ICD-10-CM | POA: Diagnosis not present

## 2021-06-01 ENCOUNTER — Telehealth: Payer: Medicare HMO | Admitting: Nurse Practitioner

## 2021-06-01 DIAGNOSIS — R110 Nausea: Secondary | ICD-10-CM | POA: Diagnosis not present

## 2021-06-01 DIAGNOSIS — U071 COVID-19: Secondary | ICD-10-CM | POA: Diagnosis not present

## 2021-06-01 DIAGNOSIS — R051 Acute cough: Secondary | ICD-10-CM

## 2021-06-01 MED ORDER — BINAXNOW COVID-19 AG HOME TEST VI KIT: PACK | 2 refills | Status: DC

## 2021-06-01 MED ORDER — ONDANSETRON HCL 4 MG PO TABS: 4.0000 mg | ORAL_TABLET | Freq: Three times a day (TID) | ORAL | 0 refills | Status: DC | PRN

## 2021-06-01 MED ORDER — MOLNUPIRAVIR EUA 200MG CAPSULE: 4.0000 | ORAL_CAPSULE | Freq: Two times a day (BID) | ORAL | 0 refills | Status: AC

## 2021-06-01 MED ORDER — BENZONATATE 100 MG PO CAPS: 100.0000 mg | ORAL_CAPSULE | Freq: Three times a day (TID) | ORAL | 0 refills | Status: DC | PRN

## 2021-06-01 NOTE — Progress Notes (Signed)
Virtual Visit Consent   Harold West, you are scheduled for a virtual visit with a [facility] provider today.     Just as with appointments in the office, your consent must be obtained to participate.  Your consent will be active for this visit and any virtual visit you may have with one of our providers in the next 365 days.     If you have a MyChart account, a copy of this consent can be sent to you electronically.  All virtual visits are billed to your insurance company just like a traditional visit in the office.    As this is a virtual visit, video technology does not allow for your provider to perform a traditional examination.  This may limit your provider's ability to fully assess your condition.  If your provider identifies any concerns that need to be evaluated in person or the need to arrange testing (such as labs, EKG, etc.), we will make arrangements to do so.     Although advances in technology are sophisticated, we cannot ensure that it will always work on either your end or our end.  If the connection with a video visit is poor, the visit may have to be switched to a telephone visit.  With either a video or telephone visit, we are not always able to ensure that we have a secure connection.     I need to obtain your verbal consent now.   Are you willing to proceed with your visit today?    Harold West has provided verbal consent on 06/01/2021 for a virtual visit (video or telephone).   Apolonio Schneiders, FNP   Date: 06/01/2021 5:25 PM   Virtual Visit via Video Note   I, Apolonio Schneiders, connected with  Harold West  ([id number], [birth year]) on 06/01/21 at  5:30 PM EST by a video-enabled telemedicine application and verified that I am speaking with the correct person using two identifiers.  Location: Patient: Virtual Visit Location Patient: Home Provider: Virtual Visit Location Provider: Home Office   I discussed the limitations of evaluation and management by telemedicine and the  availability of in person appointments. The patient expressed understanding and agreed to proceed.    History of Present Illness: Harold West is a 71 y.o. who identifies as a male who was assigned male at birth, and is being seen today after testing positive for COVID at home.   He has been having symptoms that started last night. He started to have diarrhea, a stomach ache and a cough. He has not vomited.   He does have a low grade fever.   He has not had COVID before.  He has been vaccinated for COVID x4   Most recent labs from 2021 No history of tobacco use   History of Afib resolved after ablation    Problems:  Patient Active Problem List   Diagnosis Date Noted   S/P right knee arthroscopy 01/21/20 01/24/2020   Old bucket handle tear of lateral meniscus of right knee    Acute medial meniscus tear of right knee    Atrial fibrillation ([place]) 12/07/2019   Testicular failure 02/24/2019   Vitamin D deficiency disease 02/24/2019   Obesity (BMI 30-39.9) 02/24/2019    Allergies:  Allergies  Allergen Reactions   Amiodarone Rash   Penicillins Rash    Has patient had a PCN reaction causing immediate rash, facial/tongue/throat swelling, SOB or lightheadedness with hypotension: Unknown Has patient had a PCN reaction causing severe rash involving mucus  membranes or skin necrosis: No Has patient had a PCN reaction that required hospitalization: No Has patient had a PCN reaction occurring within the last 10 years: No If all of the above answers are "NO", then may proceed with Cephalosporin use.    Medications:   Observations/Objective: Patient is well-developed, well-nourished in no acute distress.  Resting comfortably  at home.  Head is normocephalic, atraumatic.  No labored breathing.  Speech is clear and coherent with logical content.  Patient is alert and oriented at baseline.    Assessment and Plan: 1. COVID-19  - molnupiravir EUA (LAGEVRIO) 200 mg CAPS capsule; Take 4  capsules (800 mg total) by mouth 2 (two) times daily for 5 days.  Dispense: 40 capsule; Refill: 0 - COVID-19 At Home Antigen Test (BINAXNOW COVID-19 AG HOME TEST) KIT; Use as directed.  Dispense: 2 kit; Refill: 2  2. Acute cough  - benzonatate (TESSALON) 100 MG capsule; Take 1 capsule (100 mg total) by mouth 3 (three) times daily as needed for cough.  Dispense: 30 capsule; Refill: 0  3. Nausea  - ondansetron (ZOFRAN) 4 MG tablet; Take 1 tablet (4 mg total) by mouth every 8 (eight) hours as needed for nausea or vomiting.  Dispense: 20 tablet; Refill: 0     Follow Up Instructions: I discussed the assessment and treatment plan with the patient. The patient was provided an opportunity to ask questions and all were answered. The patient agreed with the plan and demonstrated an understanding of the instructions.  A copy of instructions were sent to the patient via MyChart unless otherwise noted below.    The patient was advised to call back or seek an in-person evaluation if the symptoms worsen or if the condition fails to improve as anticipated.  Time:  I spent 15 minutes with the patient via telehealth technology discussing the above problems/concerns.    Apolonio Schneiders, FNP

## 2021-06-01 NOTE — Patient Instructions (Signed)
Start anti-viral only when food and drink are tolerated.

## 2021-06-04 ENCOUNTER — Ambulatory Visit: Payer: Medicare HMO | Admitting: Internal Medicine

## 2021-06-14 ENCOUNTER — Other Ambulatory Visit: Payer: Self-pay

## 2021-06-14 ENCOUNTER — Ambulatory Visit (INDEPENDENT_AMBULATORY_CARE_PROVIDER_SITE_OTHER): Payer: Medicare HMO | Admitting: Internal Medicine

## 2021-06-14 ENCOUNTER — Encounter: Payer: Self-pay | Admitting: Internal Medicine

## 2021-06-14 VITALS — BP 132/78 | HR 74 | Resp 18 | Ht 75.0 in | Wt 307.8 lb

## 2021-06-14 DIAGNOSIS — E559 Vitamin D deficiency, unspecified: Secondary | ICD-10-CM | POA: Diagnosis not present

## 2021-06-14 DIAGNOSIS — G253 Myoclonus: Secondary | ICD-10-CM

## 2021-06-14 DIAGNOSIS — N529 Male erectile dysfunction, unspecified: Secondary | ICD-10-CM | POA: Insufficient documentation

## 2021-06-14 DIAGNOSIS — U099 Post covid-19 condition, unspecified: Secondary | ICD-10-CM | POA: Diagnosis not present

## 2021-06-14 DIAGNOSIS — Z0001 Encounter for general adult medical examination with abnormal findings: Secondary | ICD-10-CM | POA: Diagnosis not present

## 2021-06-14 DIAGNOSIS — R053 Chronic cough: Secondary | ICD-10-CM | POA: Diagnosis not present

## 2021-06-14 DIAGNOSIS — G4761 Periodic limb movement disorder: Secondary | ICD-10-CM | POA: Insufficient documentation

## 2021-06-14 DIAGNOSIS — Z1159 Encounter for screening for other viral diseases: Secondary | ICD-10-CM

## 2021-06-14 DIAGNOSIS — Z9889 Other specified postprocedural states: Secondary | ICD-10-CM | POA: Insufficient documentation

## 2021-06-14 DIAGNOSIS — Z23 Encounter for immunization: Secondary | ICD-10-CM

## 2021-06-14 DIAGNOSIS — Z125 Encounter for screening for malignant neoplasm of prostate: Secondary | ICD-10-CM

## 2021-06-14 MED ORDER — BENZONATATE 100 MG PO CAPS: 100.0000 mg | ORAL_CAPSULE | Freq: Two times a day (BID) | ORAL | 0 refills | Status: DC | PRN

## 2021-06-14 MED ORDER — SILDENAFIL CITRATE 50 MG PO TABS: 50.0000 mg | ORAL_TABLET | Freq: Every day | ORAL | 2 refills | Status: AC | PRN

## 2021-06-14 NOTE — Progress Notes (Signed)
New Patient Office Visit  Subjective:  Patient ID: Harold West, male    DOB: 12-16-1950  Age: 71 y.o. MRN: [id number]  CC:  Chief Complaint  Patient presents with   New Patient (Initial Visit)    New patient  former dr Karilyn Cota pt Pt has restless arm/leg  right normally jumps on and off has started taking magnesium heard this was good for it also pt still having lingering cough after covid pt would also like Viagra as testosterone is not working     HPI Harold West is a 71 y.o. male with past medical history of atrial fibrillation s/p cardiac ablation who presents for establishing care.  He complains of jerking movements of his arms and legs - unilateral arm or leg at a time, which are chronic, intermittent and have happened during awake as well as sleeping.  His wife reports that he has had episodes at nighttime, but patient himself does not wake up due to such jerks.  He has started taking magnesium supplement to help with it.  Denies any seizure-like activity in the past.  He has had cardiac ablation for A-fib in [place] more than 10 years ago.  He currently takes aspirin.  Denies any chest pain, dyspnea or palpitations.  He was placed on testosterone gel supplement for testosterone deficiency and erectile dysfunction, but he has not had much help with it.  He has tried Viagra with better response in the past.  He had COVID about 2 weeks ago, but still complains of cough.  He was given Tessalon, which has helped with his cough.  Denies any fever, chills, dyspnea or wheezing currently.  He has had 4 doses of COVID vaccine.  He received flu and PCV 20 vaccines in the office today.    Past Medical History:  Diagnosis Date   Acute medial meniscus tear of right knee    Arthritis    Dysrhythmia    history of AFIB   Obesity (BMI 30-39.9) 02/24/2019   Old bucket handle tear of lateral meniscus of right knee    Testicular failure 02/24/2019   Vitamin D deficiency disease 02/24/2019     Past Surgical History:  Procedure Laterality Date   COLONOSCOPY N/A 03/10/2018   Procedure: COLONOSCOPY;  Surgeon: Corbin Ade, MD;  [facility];  Service: Endoscopy;  Laterality: N/A;  7:30   KNEE ARTHROSCOPY WITH MEDIAL MENISECTOMY Right 01/21/2020   Procedure: KNEE ARTHROSCOPY WITH MEDIAL AND LATERAL MENISECTOMY;  Surgeon: Vickki Hearing, MD;  Location: [facility];  Service: Orthopedics;  Laterality: Right;   KNEE SURGERY Right 1987   POLYPECTOMY  03/10/2018   Procedure: POLYPECTOMY;  Surgeon: Corbin Ade, MD;  [facility]: [facility];  Service: Endoscopy;;  (colon)   Testicle removed Left    VASECTOMY  1989    Family History  Problem Relation Age of Onset   Hypertension Mother    Diabetes Mother    Heart disease Father     Social History   Socioeconomic History   Marital status: Married    Spouse name: Not on file   Number of children: Not on file   Years of education: Not on file   Highest education level: Not on file  Occupational History   Not on file  Tobacco Use   Smoking status: Never   Smokeless tobacco: Never  Vaping Use   Vaping Use: Never used  Substance and Sexual Activity   Alcohol use: Yes    Comment:  Occasional   Drug use: Never   Sexual activity: Yes  Other Topics Concern   Not on file  Social History Narrative   Married for 44 years.Lives with wife.Retired August 2020.   Social Determinants of Health   Financial Resource Strain: Not on file  Food Insecurity: Not on file  Transportation Needs: Not on file  Physical Activity: Not on file  Stress: Not on file  Social Connections: Not on file  Intimate Partner Violence: Not on file    ROS Review of Systems  Constitutional:  Negative for chills and fever.  HENT:  Negative for congestion and sore throat.   Eyes:  Negative for pain and discharge.  Respiratory:  Negative for cough and shortness of breath.   Cardiovascular:  Negative for chest pain and palpitations.   Gastrointestinal:  Negative for constipation, diarrhea, nausea and vomiting.  Endocrine: Negative for polydipsia and polyuria.  Genitourinary:  Negative for dysuria and hematuria.  Musculoskeletal:  Negative for neck pain and neck stiffness.  Skin:  Negative for rash.  Neurological:  Negative for dizziness, weakness, numbness and headaches.  Psychiatric/Behavioral:  Negative for agitation and behavioral problems.    Objective:   Today's Vitals: BP 132/78 (BP Location: Right Arm, Patient Position: Sitting, Cuff Size: Normal)   Pulse 74   Resp 18   Ht 6\' 3"  (1.905 m)   Wt (!) 307 lb 12.8 oz (139.6 kg)   SpO2 98%   BMI 38.47 kg/m   Physical Exam Vitals reviewed.  Constitutional:      General: He is not in acute distress.    Appearance: He is not diaphoretic.  HENT:     Head: Normocephalic and atraumatic.     Nose: Nose normal.     Mouth/Throat:     Mouth: Mucous membranes are moist.  Eyes:     General: No scleral icterus.    Extraocular Movements: Extraocular movements intact.  Cardiovascular:     Rate and Rhythm: Normal rate and regular rhythm.     Pulses: Normal pulses.     Heart sounds: Normal heart sounds. No murmur heard. Pulmonary:     Breath sounds: Normal breath sounds. No wheezing or rales.  Abdominal:     Palpations: Abdomen is soft.     Tenderness: There is no abdominal tenderness.  Musculoskeletal:     Cervical back: Neck supple. No tenderness.     Right lower leg: No edema.     Left lower leg: No edema.  Skin:    General: Skin is warm.     Findings: No rash.  Neurological:     General: No focal deficit present.     Mental Status: He is alert and oriented to person, place, and time.     Cranial Nerves: No cranial nerve deficit.     Sensory: No sensory deficit.     Motor: No weakness.  Psychiatric:        Mood and Affect: Mood normal.        Behavior: Behavior normal.    Assessment & Plan:   Problem List Items Addressed This Visit       Other    Erectile dysfunction    Has used testosterone gel for ED in the past, did not help Started Viagra as needed as he had better response with it in the past      Relevant Medications   sildenafil (VIAGRA) 50 MG tablet   History of cardiac radiofrequency ablation    For A-fib Currently  in sinus rhythm Takes aspirin      Encounter for general adult medical examination with abnormal findings - Primary    Physical exam as documented. Fasting blood tests ordered today.      Relevant Orders   TSH   Hemoglobin A1c   CMP14+EGFR   CBC with Differential/Platelet   Lipid Profile   Myoclonic jerking    His history is more consistent with myoclonic jerk then restless legs or arms Referred to neurology for further evaluation      Relevant Orders   Ambulatory referral to Neurology   Prostate cancer screening    Ordered PSA after discussing its limitations for prostate cancer screening, including false positive results leading additional investigations.      Relevant Orders   PSA   Other Visit Diagnoses     Need for immunization against influenza       Relevant Orders   Flu Vaccine QUAD High Dose(Fluad) (Completed)   Post-COVID chronic cough       Relevant Medications   benzonatate (TESSALON) 100 MG capsule   Vitamin D deficiency       Relevant Orders   VITAMIN D 25 Hydroxy (Vit-D Deficiency, Fractures)   Need for hepatitis C screening test       Relevant Orders   Hepatitis C Antibody       Outpatient Encounter Medications as of 06/14/2021  Medication Sig   Ascorbic Acid (VITAMIN C) 1000 MG tablet Take 1,000 mg by mouth daily.   aspirin EC 81 MG tablet Take 81 mg by mouth daily.   Calcium Polycarbophil (FIBER-CAPS PO) Take 5 capsules by mouth daily.   cetirizine (ZYRTEC ALLERGY) 10 MG tablet Take 1 tablet (10 mg total) by mouth daily.   Cholecalciferol (VITAMIN D3) 125 MCG (5000 UT) TABS Take 10,000 Units by mouth daily.    DHEA 50 MG CAPS Take 50 mg by mouth daily at 12  noon.   fluticasone (FLONASE) 50 MCG/ACT nasal spray Place 1 spray into both nostrils 2 (two) times daily.   Glucosamine-Chondroit-Vit C-Mn (GLUCOSAMINE 1500 COMPLEX PO) Take 2 tablets by mouth daily.   MAGNESIUM PO Take by mouth.   ondansetron (ZOFRAN) 4 MG tablet Take 1 tablet (4 mg total) by mouth every 8 (eight) hours as needed for nausea or vomiting.   OVER THE COUNTER MEDICATION Take 1 tablet by mouth daily. ketogenic weight loss complex   Rhodiola rosea (RHODIOLA PO) Take 1 capsule by mouth daily.   sildenafil (VIAGRA) 50 MG tablet Take 1 tablet (50 mg total) by mouth daily as needed for erectile dysfunction.   [DISCONTINUED] benzonatate (TESSALON) 100 MG capsule Take 1 capsule (100 mg total) by mouth 3 (three) times daily as needed for cough.   [DISCONTINUED] COVID-19 At Home Antigen Test [facility] COVID-19 AG HOME TEST) KIT Use as directed.   [DISCONTINUED] MODERNA COVID-19 VACCINE 100 MCG/0.5ML injection    [DISCONTINUED] Testosterone 20 % CREA Apply 100 mg topically 2 (two) times daily.   [DISCONTINUED] TYPHIM VI 25 MCG/0.5ML injection    [DISCONTINUED] YF-VAX injection    benzonatate (TESSALON) 100 MG capsule Take 1 capsule (100 mg total) by mouth 2 (two) times daily as needed for cough.   No facility-administered encounter medications on file as of 06/14/2021.    Follow-up: Return in about 6 months (around 12/15/2021) for Myoclonic jerk.   Anabel Halon, MD

## 2021-06-14 NOTE — Assessment & Plan Note (Signed)
His history is more consistent with myoclonic jerk then restless legs or arms ?Referred to neurology for further evaluation ?

## 2021-06-14 NOTE — Assessment & Plan Note (Signed)
Ordered PSA after discussing its limitations for prostate cancer screening, including false positive results leading additional investigations. 

## 2021-06-14 NOTE — Patient Instructions (Addendum)
Please take Vitamin D 2000 IU once daily only. ? ?Please continue taking other supplements. ? ?Okay to take Tessalon as needed for cough. ? ?You are being referred to Neurology for possible myoclonic jerk evaluation. ?

## 2021-06-14 NOTE — Assessment & Plan Note (Signed)
Physical exam as documented. ?Fasting blood tests ordered today. ?

## 2021-06-14 NOTE — Assessment & Plan Note (Signed)
For A-fib ?Currently in sinus rhythm ?Takes aspirin ?

## 2021-06-14 NOTE — Assessment & Plan Note (Signed)
Has used testosterone gel for ED in the past, did not help ?Started Viagra as needed as he had better response with it in the past ?

## 2021-06-15 LAB — HEPATITIS C ANTIBODY: Hep C Virus Ab: NONREACTIVE

## 2021-06-15 NOTE — Addendum Note (Signed)
Addended by: Zacarias Pontes R on: 06/15/2021 08:51 AM ? ? Modules accepted: Orders ? ?

## 2021-06-16 LAB — CBC WITH DIFFERENTIAL/PLATELET
Basophils Absolute: 0.1 10*3/uL (ref 0.0–0.2)
Basos: 1 %
EOS (ABSOLUTE): 0.2 10*3/uL (ref 0.0–0.4)
Eos: 3 %
Hematocrit: 44.3 % (ref 37.5–51.0)
Hemoglobin: 14.7 g/dL (ref 13.0–17.7)
Immature Grans (Abs): 0 10*3/uL (ref 0.0–0.1)
Immature Granulocytes: 0 %
Lymphocytes Absolute: 2.2 10*3/uL (ref 0.7–3.1)
Lymphs: 31 %
MCH: 27.6 pg (ref 26.6–33.0)
MCHC: 33.2 g/dL (ref 31.5–35.7)
MCV: 83 fL (ref 79–97)
Monocytes Absolute: 0.6 10*3/uL (ref 0.1–0.9)
Monocytes: 9 %
Neutrophils Absolute: 3.9 10*3/uL (ref 1.4–7.0)
Neutrophils: 56 %
Platelets: 227 10*3/uL (ref 150–450)
RBC: 5.33 x10E6/uL (ref 4.14–5.80)
RDW: 13.7 % (ref 11.6–15.4)
WBC: 6.9 10*3/uL (ref 3.4–10.8)

## 2021-06-16 LAB — CMP14+EGFR
ALT: 21 IU/L (ref 0–44)
AST: 18 IU/L (ref 0–40)
Albumin/Globulin Ratio: 2.3 — ABNORMAL HIGH (ref 1.2–2.2)
Albumin: 4.3 g/dL (ref 3.8–4.8)
Alkaline Phosphatase: 57 IU/L (ref 44–121)
BUN/Creatinine Ratio: 16 (ref 10–24)
BUN: 15 mg/dL (ref 8–27)
Bilirubin Total: 0.4 mg/dL (ref 0.0–1.2)
CO2: 24 mmol/L (ref 20–29)
Calcium: 9.4 mg/dL (ref 8.6–10.2)
Chloride: 106 mmol/L (ref 96–106)
Creatinine, Ser: 0.95 mg/dL (ref 0.76–1.27)
Globulin, Total: 1.9 g/dL (ref 1.5–4.5)
Glucose: 92 mg/dL (ref 70–99)
Potassium: 4.4 mmol/L (ref 3.5–5.2)
Sodium: 144 mmol/L (ref 134–144)
Total Protein: 6.2 g/dL (ref 6.0–8.5)
eGFR: 87 mL/min/{1.73_m2} (ref >59)

## 2021-06-16 LAB — LIPID PANEL
Chol/HDL Ratio: 2.6 ratio (ref 0.0–5.0)
Cholesterol, Total: 102 mg/dL (ref 100–199)
HDL: 40 mg/dL (ref >39)
LDL Chol Calc (NIH): 51 mg/dL (ref 0–99)
Triglycerides: 42 mg/dL (ref 0–149)
VLDL Cholesterol Cal: 11 mg/dL (ref 5–40)

## 2021-06-16 LAB — HEMOGLOBIN A1C
Est. average glucose Bld gHb Est-mCnc: 117 mg/dL
Hgb A1c MFr Bld: 5.7 % — ABNORMAL HIGH (ref 4.8–5.6)

## 2021-06-16 LAB — PSA: Prostate Specific Ag, Serum: 2.3 ng/mL (ref 0.0–4.0)

## 2021-06-16 LAB — TSH: TSH: 1.15 u[IU]/mL (ref 0.450–4.500)

## 2021-06-16 LAB — VITAMIN D 25 HYDROXY (VIT D DEFICIENCY, FRACTURES): Vit D, 25-Hydroxy: 50.3 ng/mL (ref 30.0–100.0)

## 2021-06-20 ENCOUNTER — Ambulatory Visit: Payer: Medicare HMO | Admitting: Neurology

## 2021-06-20 ENCOUNTER — Encounter: Payer: Self-pay | Admitting: Neurology

## 2021-06-20 VITALS — BP 156/96 | HR 72 | Ht 75.0 in | Wt 312.0 lb

## 2021-06-20 DIAGNOSIS — G4761 Periodic limb movement disorder: Secondary | ICD-10-CM

## 2021-06-20 DIAGNOSIS — R799 Abnormal finding of blood chemistry, unspecified: Secondary | ICD-10-CM | POA: Diagnosis not present

## 2021-06-20 DIAGNOSIS — E538 Deficiency of other specified B group vitamins: Secondary | ICD-10-CM | POA: Diagnosis not present

## 2021-06-20 NOTE — Progress Notes (Signed)
[facility]  PATIENT: Harold West DOB: 1950/08/24  REQUESTING CLINICIAN: Anabel Halon, MD HISTORY FROM: Patient  REASON FOR VISIT: Periodic jerks   HISTORICAL  CHIEF COMPLAINT:  Chief Complaint  Patient presents with   New Patient (Initial Visit)    Rm 12. Alone. NP Internal referral for myoclonic jerking. Jerking is more prevalent when fatigued. Jerking occurs during sleep. More frequent in right arm in leg but can occur in other limbs.    HISTORY OF PRESENT ILLNESS:  This is a 71 year old gentleman past medical history of atrial fibrillation status post ablation who is presenting with complaint of jerks, body jerks for the past 4 to 5 years.  Patient reported that he experiences jerks intermittently, involving the right upper and lower extremity more than the left upper and lower extremity.  These jerks can happen anytime of the day but he notices more often in the evening.  He also reported wife told him that he has these jerks during sleep.  These jerks are not bothersome to patient but he reported wife is worried.  Other than that, he reports good health, he reports good sleep.  He did have a sleep study, was not found to have sleep apnea.  Denies any headaches, vision changes, no numbness and no other complaints.     OTHER MEDICAL CONDITIONS: Atrial fibrillation   REVIEW OF SYSTEMS: Full 14 system review of systems performed and negative with exception of: as noted in the HPI   ALLERGIES: Allergies  Allergen Reactions   Amiodarone Rash   Penicillins Rash    Has patient had a PCN reaction causing immediate rash, facial/tongue/throat swelling, SOB or lightheadedness with hypotension: Unknown Has patient had a PCN reaction causing severe rash involving mucus membranes or skin necrosis: No Has patient had a PCN reaction that required hospitalization: No Has patient had a PCN reaction occurring within the last 10 years: No If all of the above answers  are "NO", then may proceed with Cephalosporin use.     HOME MEDICATIONS: Outpatient Medications Prior to Visit  Medication Sig Dispense Refill   Ascorbic Acid (VITAMIN C) 1000 MG tablet Take 1,000 mg by mouth daily.     aspirin EC 81 MG tablet Take 81 mg by mouth daily.     benzonatate (TESSALON) 100 MG capsule Take 1 capsule (100 mg total) by mouth 2 (two) times daily as needed for cough. 30 capsule 0   Calcium Polycarbophil (FIBER-CAPS PO) Take 5 capsules by mouth daily.     Cholecalciferol (VITAMIN D3) 125 MCG (5000 UT) TABS Take 10,000 Units by mouth daily.      DHEA 50 MG CAPS Take 50 mg by mouth daily at 12 noon.     Glucosamine-Chondroit-Vit C-Mn (GLUCOSAMINE 1500 COMPLEX PO) Take 2 tablets by mouth daily.     MAGNESIUM PO Take by mouth.     OVER THE COUNTER MEDICATION Take 1 tablet by mouth daily. ketogenic weight loss complex     Rhodiola rosea (RHODIOLA PO) Take 1 capsule by mouth daily.     sildenafil (VIAGRA) 50 MG tablet Take 1 tablet (50 mg total) by mouth daily as needed for erectile dysfunction. (Patient not taking: Reported on 06/20/2021) 10 tablet 2   cetirizine (ZYRTEC ALLERGY) 10 MG tablet Take 1 tablet (10 mg total) by mouth daily. 30 tablet 2   fluticasone (FLONASE) 50 MCG/ACT nasal spray Place 1 spray into both nostrils 2 (two) times daily. 16 g 2   ondansetron (ZOFRAN)  4 MG tablet Take 1 tablet (4 mg total) by mouth every 8 (eight) hours as needed for nausea or vomiting. 20 tablet 0   No facility-administered medications prior to visit.    PAST MEDICAL HISTORY: Past Medical History:  Diagnosis Date   Acute medial meniscus tear of right knee    Arthritis    Dysrhythmia    history of AFIB   Obesity (BMI 30-39.9) 02/24/2019   Old bucket handle tear of lateral meniscus of right knee    Testicular failure 02/24/2019   Vitamin D deficiency disease 02/24/2019    PAST SURGICAL HISTORY: Past Surgical History:  Procedure Laterality Date   COLONOSCOPY N/A  03/10/2018   Procedure: COLONOSCOPY;  Surgeon: Corbin Ade, MD;  [facility];  Service: Endoscopy;  Laterality: N/A;  7:30   KNEE ARTHROSCOPY WITH MEDIAL MENISECTOMY Right 01/21/2020   Procedure: KNEE ARTHROSCOPY WITH MEDIAL AND LATERAL MENISECTOMY;  Surgeon: Vickki Hearing, MD;  Location: [facility];  Service: Orthopedics;  Laterality: Right;   KNEE SURGERY Right 1987   POLYPECTOMY  03/10/2018   Procedure: POLYPECTOMY;  Surgeon: Corbin Ade, MD;  [facility];  Service: Endoscopy;;  (colon)   Testicle removed Left    VASECTOMY  1989    FAMILY HISTORY: Family History  Problem Relation Age of Onset   Hypertension Mother    Diabetes Mother    Heart disease Father     SOCIAL HISTORY: Social History   Socioeconomic History   Marital status: Married    Spouse name: Not on file   Number of children: Not on file   Years of education: Not on file   Highest education level: Not on file  Occupational History   Not on file  Tobacco Use   Smoking status: Never   Smokeless tobacco: Never  Vaping Use   Vaping Use: Never used  Substance and Sexual Activity   Alcohol use: Yes    Comment: Occasional   Drug use: Never   Sexual activity: Yes  Other Topics Concern   Not on file  Social History Narrative   Married for 44 years.Lives with wife.Retired August 2020.   Social Determinants of Health   Financial Resource Strain: Not on file  Food Insecurity: Not on file  Transportation Needs: Not on file  Physical Activity: Not on file  Stress: Not on file  Social Connections: Not on file  Intimate Partner Violence: Not on file    PHYSICAL EXAM  GENERAL EXAM/CONSTITUTIONAL: Vitals:  Vitals:   06/20/21 1019  BP: (!) 156/96  Pulse: 72  Weight: (!) 312 lb (141.5 kg)  Height: 6\' 3"  (1.905 m)   Body mass index is 39 kg/m. Wt Readings from Last 3 Encounters:  06/20/21 (!) 312 lb (141.5 kg)  06/14/21 (!) 307 lb 12.8 oz (139.6 kg)  10/26/20 (!) 322  lb (146.1 kg)   Patient is in no distress; well developed, nourished and groomed; neck is supple  CARDIOVASCULAR: Examination of carotid arteries is normal; no carotid bruits Regular rate and rhythm, no murmurs Examination of peripheral vascular system by observation and palpation is normal  EYES: Pupils round and reactive to light, Visual fields full to confrontation, Extraocular movements intacts,   MUSCULOSKELETAL: Gait, strength, tone, movements noted in Neurologic exam below  NEUROLOGIC: MENTAL STATUS:  No flowsheet data found. awake, alert, oriented to person, place and time recent and remote memory intact normal attention and concentration language fluent, comprehension intact, naming intact fund of  knowledge appropriate  CRANIAL NERVE:  2nd, 3rd, 4th, 6th - pupils equal and reactive to light, visual fields full to confrontation, extraocular muscles intact, no nystagmus 5th - facial sensation symmetric 7th - facial strength symmetric 8th - hearing intact 9th - palate elevates symmetrically, uvula midline 11th - shoulder shrug symmetric 12th - tongue protrusion midline  MOTOR:  normal bulk and tone, full strength in the BUE, BLE  SENSORY:  normal and symmetric to light touch, pinprick, temperature, vibration  COORDINATION:  finger-nose-finger, fine finger movements normal  REFLEXES:  deep tendon reflexes present and symmetric  GAIT/STATION:  normal     DIAGNOSTIC DATA (LABS, IMAGING, TESTING) - I reviewed patient records, labs, notes, testing and imaging myself where available.  Lab Results  Component Value Date   WBC 6.9 06/14/2021   HGB 14.7 06/14/2021   HCT 44.3 06/14/2021   MCV 83 06/14/2021   PLT 227 06/14/2021      Component Value Date/Time   NA 144 06/14/2021 1650   K 4.4 06/14/2021 1650   CL 106 06/14/2021 1650   CO2 24 06/14/2021 1650   GLUCOSE 92 06/14/2021 1650   GLUCOSE 101 (H) 01/19/2020 1434   BUN 15 06/14/2021 1650    CREATININE 0.95 06/14/2021 1650   CREATININE 1.03 11/30/2019 1653   CALCIUM 9.4 06/14/2021 1650   PROT 6.2 06/14/2021 1650   ALBUMIN 4.3 06/14/2021 1650   AST 18 06/14/2021 1650   ALT 21 06/14/2021 1650   ALKPHOS 57 06/14/2021 1650   BILITOT 0.4 06/14/2021 1650   GFRNONAA >60 01/19/2020 1434   GFRNONAA 75 11/30/2019 1653   GFRAA 87 11/30/2019 1653   Lab Results  Component Value Date   CHOL 102 06/14/2021   HDL 40 06/14/2021   LDLCALC 51 06/14/2021   TRIG 42 06/14/2021   CHOLHDL 2.6 06/14/2021   Lab Results  Component Value Date   HGBA1C 5.7 (H) 06/14/2021   No results found for: [id number] Lab Results  Component Value Date   TSH 1.150 06/14/2021     ASSESSMENT AND PLAN  71 y.o. year old male with complaint of periodic limb jerks more so on the right upper and lower extremities than the left that happened throughout the day but more at night.  Per description of the symptoms, this is more consistent with periodic limb movement.  I will start by doing iron studies.  If normal, then I will start the patient on pramipexole.  I will like to see him again in 6 months for follow-up.    1. Periodic limb movement     Patient Instructions  We will obtain iron study,  If normal will start Pramipexole for periodic limb movement Follow-up in 71-months    Orders Placed This Encounter  Procedures   Iron, TIBC and Ferritin Panel    No orders of the defined types were placed in this encounter.   Return in about 6 months (around 12/21/2021).  I have spent a total of 45 minutes dedicated to this patient today, preparing to see patient, performing a medically appropriate examination and evaluation, ordering tests and/or medications and procedures, and counseling and educating the patient/family/caregiver; independently interpreting result and communicating results to the family/patient/caregiver; and documenting clinical information in the electronic medical record.   Windell Norfolk, MD 06/20/2021, 1:54 PM  [facility] [address], [address] [place], [place] [address] [phone]

## 2021-06-20 NOTE — Patient Instructions (Addendum)
We will obtain iron study,  ?If normal will start Pramipexole for periodic limb movement ?Follow-up in 107-month ? ? ?

## 2021-06-21 LAB — IRON,TIBC AND FERRITIN PANEL
Ferritin: 408 ng/mL — ABNORMAL HIGH (ref 30–400)
Iron Saturation: 41 % (ref 15–55)
Iron: 85 ug/dL (ref 38–169)
Total Iron Binding Capacity: 206 ug/dL — ABNORMAL LOW (ref 250–450)
UIBC: 121 ug/dL (ref 111–343)

## 2021-06-22 ENCOUNTER — Other Ambulatory Visit: Payer: Self-pay | Admitting: Neurology

## 2021-06-22 MED ORDER — PRAMIPEXOLE DIHYDROCHLORIDE 0.125 MG PO TABS: 0.1250 mg | ORAL_TABLET | Freq: Every evening | ORAL | 6 refills | Status: DC

## 2021-06-22 NOTE — Progress Notes (Signed)
Call patient discussed iron studies, I will start him on Pramipexole for RLS. Advise him to follow up with his PCP to repeat Iron studies in 2 to 3 months due to elevated Ferritin and low TIBC.  ?

## 2021-06-26 ENCOUNTER — Ambulatory Visit: Payer: Medicare HMO | Admitting: Internal Medicine

## 2021-06-28 ENCOUNTER — Encounter: Payer: Self-pay | Admitting: Physician Assistant

## 2021-06-28 ENCOUNTER — Other Ambulatory Visit: Payer: Self-pay

## 2021-06-28 ENCOUNTER — Ambulatory Visit: Payer: Medicare HMO | Admitting: Physician Assistant

## 2021-06-28 DIAGNOSIS — Z85828 Personal history of other malignant neoplasm of skin: Secondary | ICD-10-CM

## 2021-07-04 ENCOUNTER — Observation Stay (HOSPITAL_COMMUNITY)
Admission: EM | Admit: 2021-07-04 | Discharge: 2021-07-05 | Disposition: A | Discharge status: Home / Self Care | Payer: Medicare HMO | Attending: Internal Medicine | Admitting: Internal Medicine

## 2021-07-04 ENCOUNTER — Encounter: Payer: Self-pay | Admitting: Internal Medicine

## 2021-07-04 ENCOUNTER — Emergency Department (HOSPITAL_COMMUNITY): Payer: Medicare HMO

## 2021-07-04 ENCOUNTER — Encounter (HOSPITAL_COMMUNITY): Payer: Self-pay | Admitting: *Deleted

## 2021-07-04 ENCOUNTER — Telehealth: Payer: Self-pay | Admitting: Neurology

## 2021-07-04 ENCOUNTER — Other Ambulatory Visit: Payer: Self-pay

## 2021-07-04 DIAGNOSIS — Z7982 Long term (current) use of aspirin: Secondary | ICD-10-CM | POA: Diagnosis not present

## 2021-07-04 DIAGNOSIS — Z20822 Contact with and (suspected) exposure to covid-19: Secondary | ICD-10-CM | POA: Insufficient documentation

## 2021-07-04 DIAGNOSIS — G2581 Restless legs syndrome: Secondary | ICD-10-CM

## 2021-07-04 DIAGNOSIS — I4892 Unspecified atrial flutter: Secondary | ICD-10-CM

## 2021-07-04 DIAGNOSIS — I4891 Unspecified atrial fibrillation: Secondary | ICD-10-CM

## 2021-07-04 DIAGNOSIS — I4819 Other persistent atrial fibrillation: Principal | ICD-10-CM | POA: Insufficient documentation

## 2021-07-04 DIAGNOSIS — E559 Vitamin D deficiency, unspecified: Secondary | ICD-10-CM | POA: Diagnosis present

## 2021-07-04 DIAGNOSIS — R531 Weakness: Secondary | ICD-10-CM | POA: Diagnosis not present

## 2021-07-04 DIAGNOSIS — E669 Obesity, unspecified: Secondary | ICD-10-CM | POA: Diagnosis present

## 2021-07-04 DIAGNOSIS — Z79899 Other long term (current) drug therapy: Secondary | ICD-10-CM | POA: Diagnosis not present

## 2021-07-04 DIAGNOSIS — R002 Palpitations: Secondary | ICD-10-CM | POA: Diagnosis present

## 2021-07-04 LAB — COMPREHENSIVE METABOLIC PANEL
ALT: 27 U/L (ref 0–44)
AST: 26 U/L (ref 15–41)
Albumin: 4.2 g/dL (ref 3.5–5.0)
Alkaline Phosphatase: 50 U/L (ref 38–126)
Anion gap: 3 — ABNORMAL LOW (ref 5–15)
BUN: 21 mg/dL (ref 8–23)
CO2: 26 mmol/L (ref 22–32)
Calcium: 9.1 mg/dL (ref 8.9–10.3)
Chloride: 108 mmol/L (ref 98–111)
Creatinine, Ser: 1.14 mg/dL (ref 0.61–1.24)
GFR, Estimated: >60 mL/min (ref >60)
Glucose, Bld: 107 mg/dL — ABNORMAL HIGH (ref 70–99)
Potassium: 3.9 mmol/L (ref 3.5–5.1)
Sodium: 137 mmol/L (ref 135–145)
Total Bilirubin: 1.2 mg/dL (ref 0.3–1.2)
Total Protein: 7.1 g/dL (ref 6.5–8.1)

## 2021-07-04 LAB — HIV ANTIBODY (ROUTINE TESTING W REFLEX): HIV Screen 4th Generation wRfx: NONREACTIVE

## 2021-07-04 LAB — CBC WITH DIFFERENTIAL/PLATELET
Abs Immature Granulocytes: 0.04 10*3/uL (ref 0.00–0.07)
Basophils Absolute: 0.1 10*3/uL (ref 0.0–0.1)
Basophils Relative: 1 %
Eosinophils Absolute: 0.1 10*3/uL (ref 0.0–0.5)
Eosinophils Relative: 1 %
HCT: 47.3 % (ref 39.0–52.0)
Hemoglobin: 15 g/dL (ref 13.0–17.0)
Immature Granulocytes: 1 %
Lymphocytes Relative: 27 %
Lymphs Abs: 2.3 10*3/uL (ref 0.7–4.0)
MCH: 27.9 pg (ref 26.0–34.0)
MCHC: 31.7 g/dL (ref 30.0–36.0)
MCV: 88.1 fL (ref 80.0–100.0)
Monocytes Absolute: 0.6 10*3/uL (ref 0.1–1.0)
Monocytes Relative: 7 %
Neutro Abs: 5.5 10*3/uL (ref 1.7–7.7)
Neutrophils Relative %: 63 %
Platelets: 225 10*3/uL (ref 150–400)
RBC: 5.37 MIL/uL (ref 4.22–5.81)
RDW: 14.5 % (ref 11.5–15.5)
WBC: 8.6 10*3/uL (ref 4.0–10.5)
nRBC: 0 % (ref 0.0–0.2)

## 2021-07-04 LAB — PHOSPHORUS: Phosphorus: 3.3 mg/dL (ref 2.5–4.6)

## 2021-07-04 LAB — MRSA NEXT GEN BY PCR, NASAL: MRSA by PCR Next Gen: NOT DETECTED

## 2021-07-04 LAB — TROPONIN I (HIGH SENSITIVITY)
Troponin I (High Sensitivity): 13 ng/L (ref <18)
Troponin I (High Sensitivity): 11 ng/L (ref <18)

## 2021-07-04 LAB — MAGNESIUM: Magnesium: 2.4 mg/dL (ref 1.7–2.4)

## 2021-07-04 LAB — RESP PANEL BY RT-PCR (FLU A&B, COVID) ARPGX2
Influenza A by PCR: NEGATIVE
Influenza B by PCR: NEGATIVE
SARS Coronavirus 2 by RT PCR: NEGATIVE

## 2021-07-04 LAB — HEPARIN LEVEL (UNFRACTIONATED): Heparin Unfractionated: 0.28 IU/mL — ABNORMAL LOW (ref 0.30–0.70)

## 2021-07-04 LAB — TSH: TSH: 1.156 u[IU]/mL (ref 0.350–4.500)

## 2021-07-04 MED ORDER — ACETAMINOPHEN 650 MG RE SUPP: 650.0000 mg | Freq: Four times a day (QID) | RECTAL | Status: DC | PRN

## 2021-07-04 MED ORDER — ADENOSINE 6 MG/2ML IV SOLN
INTRAVENOUS | Status: AC
  Administered 2021-07-04: 12 mg
  Filled 2021-07-04: qty 4

## 2021-07-04 MED ORDER — SODIUM CHLORIDE 0.9 % IV SOLN: 250.0000 mL | INTRAVENOUS | Status: DC | PRN

## 2021-07-04 MED ORDER — ADENOSINE 6 MG/2ML IV SOLN
INTRAVENOUS | Status: AC
  Administered 2021-07-04: 6 mg
  Filled 2021-07-04: qty 2

## 2021-07-04 MED ORDER — METOPROLOL TARTRATE 5 MG/5ML IV SOLN
5.0000 mg | Freq: Once | INTRAVENOUS | Status: AC
  Administered 2021-07-04: 2.5 mg via INTRAVENOUS
  Filled 2021-07-04: qty 5

## 2021-07-04 MED ORDER — HEPARIN BOLUS VIA INFUSION
4000.0000 [IU] | Freq: Once | INTRAVENOUS | Status: AC
  Administered 2021-07-04: 4000 [IU] via INTRAVENOUS

## 2021-07-04 MED ORDER — ONDANSETRON HCL 4 MG/2ML IJ SOLN: 4.0000 mg | Freq: Four times a day (QID) | INTRAMUSCULAR | Status: DC | PRN

## 2021-07-04 MED ORDER — DILTIAZEM HCL 25 MG/5ML IV SOLN
10.0000 mg | Freq: Once | INTRAVENOUS | Status: AC
  Administered 2021-07-04: 10 mg via INTRAVENOUS
  Filled 2021-07-04: qty 5

## 2021-07-04 MED ORDER — DILTIAZEM HCL-DEXTROSE 125-5 MG/125ML-% IV SOLN (PREMIX)
5.0000 mg/h | INTRAVENOUS | Status: DC
  Administered 2021-07-04: 5 mg/h via INTRAVENOUS
  Filled 2021-07-04: qty 125

## 2021-07-04 MED ORDER — ONDANSETRON HCL 4 MG PO TABS: 4.0000 mg | ORAL_TABLET | Freq: Four times a day (QID) | ORAL | Status: DC | PRN

## 2021-07-04 MED ORDER — CHLORHEXIDINE GLUCONATE CLOTH 2 % EX PADS
6.0000 | MEDICATED_PAD | Freq: Every day | CUTANEOUS | Status: DC
  Administered 2021-07-04 – 2021-07-05 (×2): 6 via TOPICAL

## 2021-07-04 MED ORDER — DIGOXIN 0.25 MG/ML IJ SOLN
0.5000 mg | Freq: Once | INTRAMUSCULAR | Status: AC
  Administered 2021-07-04: 0.5 mg via INTRAVENOUS
  Filled 2021-07-04: qty 2

## 2021-07-04 MED ORDER — SODIUM CHLORIDE 0.9% FLUSH: 3.0000 mL | INTRAVENOUS | Status: DC | PRN

## 2021-07-04 MED ORDER — SODIUM CHLORIDE 0.9% FLUSH
3.0000 mL | Freq: Two times a day (BID) | INTRAVENOUS | Status: DC
  Administered 2021-07-04 – 2021-07-05 (×2): 3 mL via INTRAVENOUS

## 2021-07-04 MED ORDER — ACETAMINOPHEN 325 MG PO TABS: 650.0000 mg | ORAL_TABLET | Freq: Four times a day (QID) | ORAL | Status: DC | PRN

## 2021-07-04 MED ORDER — HEPARIN (PORCINE) 25000 UT/250ML-% IV SOLN: 1600.0000 [IU]/h | INTRAVENOUS | Status: DC

## 2021-07-04 MED ORDER — METOPROLOL TARTRATE 5 MG/5ML IV SOLN: 5.0000 mg | INTRAVENOUS | Status: DC | PRN

## 2021-07-04 MED ORDER — HEPARIN (PORCINE) 25000 UT/250ML-% IV SOLN
1750.0000 [IU]/h | INTRAVENOUS | Status: DC
  Administered 2021-07-04: 1600 [IU]/h via INTRAVENOUS
  Administered 2021-07-05: 1750 [IU]/h via INTRAVENOUS
  Filled 2021-07-04 (×2): qty 250

## 2021-07-04 NOTE — Consult Note (Signed)
?Cardiology Consultation:  ? ?Patient ID: Harold West ?MRN: [id number]; DOB: 1950/10/10 ? ?Admit date: 07/04/2021 ?Date of Consult: 07/04/2021 ? ?PCP:  Lindell Spar, MD ?  ?[facility] Providers ?Cardiologist:  New ? ? ?Patient Profile:  ? ?Harold West is a 71 y.o. male with a hx of afib s/p ablation 10 years ago in [employer]  who is being seen 07/04/2021 for the evaluation of tachycardia at the request of Dr Dyann Kief. ? ?History of Present Illness:  ? ?Mr. Harold West 71 yo male reported history of afib with prior ablation (from notes done in [place] over 10 years ago, has not been on anticoag). He reports he believes he initially had an afib ablation and then sometime after an aflutter ablation. He is not sure which hospital in [place]. Had done well 10 years since procedures. Over last week noticed some weakness, SOB, palpitaitons. Home HRs 160s-170s.  ? ? ? ?WBC 8.6 Hgb 15 Plt 225 K 3.9 Cr 1.14 BUN 21 Mg 2.4  ?Trop 13--> ?COVID neg flu neg ?CXR no acute process ? ?In ER received  adenosine '6mg'$  then '12mg'$ . Given '10mg'$  IV dilt and 2.'5mg'$  IV lopressor, started on dilt gtt but low bp's. Subsequently given 0.'5mg'$  IV digoxin  ?Past Medical History:  ?Diagnosis Date  ? Acute medial meniscus tear of right knee   ? Arthritis   ? Dysrhythmia   ? history of AFIB  ? Obesity (BMI 30-39.9) 02/24/2019  ? Old bucket handle tear of lateral meniscus of right knee   ? Testicular failure 02/24/2019  ? Vitamin D deficiency disease 02/24/2019  ? ? ?Past Surgical History:  ?Procedure Laterality Date  ? COLONOSCOPY N/A 03/10/2018  ? Procedure: COLONOSCOPY;  Surgeon: Daneil Dolin, MD;  [facility];  Service: Endoscopy;  Laterality: N/A;  7:30  ? KNEE ARTHROSCOPY WITH MEDIAL MENISECTOMY Right 01/21/2020  ? Procedure: KNEE ARTHROSCOPY WITH MEDIAL AND LATERAL MENISECTOMY;  Surgeon: Carole Civil, MD;  Location: [facility];  Service: Orthopedics;  Laterality: Right;  ? KNEE SURGERY Right 1987  ? POLYPECTOMY  03/10/2018  ? Procedure:  POLYPECTOMY;  Surgeon: Daneil Dolin, MD;  Location: [facility];  Service: Endoscopy;;  (colon)  ? Testicle removed Left   ? VASECTOMY  1989  ?  ? ? ?Inpatient Medications: ?Scheduled Meds: ? sodium chloride flush  3 mL Intravenous Q12H  ? ?Continuous Infusions: ? sodium chloride    ? heparin 1,600 Units/hr (07/04/21 1411)  ? ?PRN Meds: ?sodium chloride, acetaminophen **OR** acetaminophen, metoprolol tartrate, ondansetron **OR** ondansetron (ZOFRAN) IV, sodium chloride flush ? ?Allergies:    ?Allergies  ?Allergen Reactions  ? Amiodarone Rash  ? Penicillins Rash  ?  Has patient had a PCN reaction causing immediate rash, facial/tongue/throat swelling, SOB or lightheadedness with hypotension: Unknown ?Has patient had a PCN reaction causing severe rash involving mucus membranes or skin necrosis: No ?Has patient had a PCN reaction that required hospitalization: No ?Has patient had a PCN reaction occurring within the last 10 years: No ?If all of the above answers are "NO", then may proceed with Cephalosporin use. ?  ? ? ?Social History:   ?Social History  ? ?Socioeconomic History  ? Marital status: Married  ?  Spouse name: Not on file  ? Number of children: Not on file  ? Years of education: Not on file  ? Highest education level: Not on file  ?Occupational History  ? Not on file  ?Tobacco Use  ? Smoking status: Never  ?  Smokeless tobacco: Never  ?Vaping Use  ? Vaping Use: Never used  ?Substance and Sexual Activity  ? Alcohol use: Yes  ?  Comment: Occasional  ? Drug use: Never  ? Sexual activity: Yes  ?Other Topics Concern  ? Not on file  ?Social History Narrative  ? Married for 44 years.Lives with wife.Retired August 2020.  ? ?Social Determinants of Health  ? ?Financial Resource Strain: Not on file  ?Food Insecurity: Not on file  ?Transportation Needs: Not on file  ?Physical Activity: Not on file  ?Stress: Not on file  ?Social Connections: Not on file  ?Intimate Partner Violence: Not on file  ?  ?Family History:    ? ?Family History  ?Problem Relation Age of Onset  ? Hypertension Mother   ? Diabetes Mother   ? Heart disease Father   ?  ? ?ROS:  ?Please see the history of present illness.  ? ?All other ROS reviewed and negative.    ? ?Physical Exam/Data:  ? ?Vitals:  ? 07/04/21 1224 07/04/21 1230 07/04/21 1300 07/04/21 1315  ?BP:  113/72 113/64   ?Pulse:  (!) 125 (!) 111 84  ?Resp: '19 17 13 11  '$ ?Temp:      ?TempSrc:      ?SpO2: 99% 98% 97% 97%  ?Weight:      ?Height:      ? ?No intake or output data in the 24 hours ending 07/04/21 1413 ? ?  07/04/2021  ? 11:49 AM 06/20/2021  ? 10:19 AM 06/14/2021  ?  3:32 PM  ?Last 3 Weights  ?Weight (lbs) 305 lb 312 lb 307 lb 12.8 oz  ?Weight (kg) 138.347 kg 141.522 kg 139.617 kg  ?   ?Body mass index is 38.12 kg/m?.  ?General:  Well nourished, well developed, in no acute distress ?HEENT: normal ?Neck: no JVD ?Vascular: No carotid bruits; Distal pulses 2+ bilaterally ?Cardiac:  irreg, tachy ?Lungs:  clear to auscultation bilaterally, no wheezing, rhonchi or rales  ?Abd: soft, nontender, no hepatomegaly  ?Ext: no edema ?Musculoskeletal:  No deformities, BUE and BLE strength normal and equal ?Skin: warm and dry  ?Neuro:  CNs 2-12 intact, no focal abnormalities noted ?Psych:  Normal affect  ? ? ? ?Laboratory Data: ? ?High Sensitivity Troponin:   ?Recent Labs  ?Lab 07/04/21 ?1134 07/04/21 ?1326  ?TROPONINIHS 13 11  ?   ?Chemistry ?Recent Labs  ?Lab 07/04/21 ?1134  ?NA 137  ?K 3.9  ?CL 108  ?CO2 26  ?GLUCOSE 107*  ?BUN 21  ?CREATININE 1.14  ?CALCIUM 9.1  ?MG 2.4  ?GFRNONAA >60  ?ANIONGAP 3*  ?  ?Recent Labs  ?Lab 07/04/21 ?1134  ?PROT 7.1  ?ALBUMIN 4.2  ?AST 26  ?ALT 27  ?ALKPHOS 50  ?BILITOT 1.2  ? ?Lipids No results for input(s): CHOL, TRIG, HDL, LABVLDL, LDLCALC, CHOLHDL in the last 168 hours.  ?Hematology ?Recent Labs  ?Lab 07/04/21 ?1134  ?WBC 8.6  ?RBC 5.37  ?HGB 15.0  ?HCT 47.3  ?MCV 88.1  ?MCH 27.9  ?MCHC 31.7  ?RDW 14.5  ?PLT 225  ? ?Thyroid No results for input(s): TSH, FREET4 in the last  168 hours.  ?BNPNo results for input(s): BNP, PROBNP in the last 168 hours.  ?DDimer No results for input(s): DDIMER in the last 168 hours. ? ? ?Radiology/Studies:  ?DG Chest Port 1 View ? ?Result Date: 07/04/2021 ?CLINICAL DATA:  Weakness. EXAM: PORTABLE CHEST 1 VIEW COMPARISON:  None. FINDINGS: The heart size and mediastinal contours are within normal  limits. Both lungs are clear. The visualized skeletal structures are unremarkable. IMPRESSION: No active disease. Electronically Signed   By: Marijo Conception M.D.   On: 07/04/2021 11:54   ? ? ?Assessment and Plan:  ? ?1.Aflutter ?- reported history of prior ablations 10 years ago,I do not know the full history.He believes he had an afib ablation and later aflutter ablation. Had done well since that time until just this week.  Has not been on anticoag ?- presents with aflutter with RVR. Some low bp's with initial attempts at rate control on dilt gtt, given IV digoxin 0.'5mg'$  x 1 ?- rates 80s to 100 currently, would manage with IV lopressor for now. Plan for TEE/DCCV tomorrow.  ? ? ?-CHADS2Vasc score would appear to just be 1 based on age and known medical history. Given we plan for TEE/DCCV will need heparin gtt today, convert to [place] later in admission and will need at least 1 month after cardioversion.  ? ? ?Risk Assessment/Risk Scores:  ? ? ?CHA2DS2-VASc Score = 1  ? This indicates a 0.6% annual risk of stroke. ?The patient's score is based upon: ?CHF History: 0 ?HTN History: 0 ?Diabetes History: 0 ?Stroke History: 0 ?Vascular Disease History: 0 ?Age Score: 1 ?Gender Score: 0 ?  ?  ?Shared Decision Making/Informed Consent ?The risks [stroke, cardiac arrhythmias rarely resulting in the need for a temporary or permanent pacemaker, skin irritation or burns, esophageal damage, perforation (1:10,000 risk), bleeding, pharyngeal hematoma as well as other potential complications associated with conscious sedation including aspiration, arrhythmia, respiratory failure and  death], benefits (treatment guidance, restoration of normal sinus rhythm, diagnostic support) and alternatives of a transesophageal echocardiogram guided cardioversion were discussed in detail with Mr. Gladson

## 2021-07-04 NOTE — Progress Notes (Addendum)
ANTICOAGULATION CONSULT NOTE - Initial Consult ? ?Pharmacy Consult for Heparin ?Indication: atrial fibrillation ? ?Allergies  ?Allergen Reactions  ? Amiodarone Rash  ? Penicillins Rash  ?  Has patient had a PCN reaction causing immediate rash, facial/tongue/throat swelling, SOB or lightheadedness with hypotension: Unknown ?Has patient had a PCN reaction causing severe rash involving mucus membranes or skin necrosis: No ?Has patient had a PCN reaction that required hospitalization: No ?Has patient had a PCN reaction occurring within the last 10 years: No ?If all of the above answers are "NO", then may proceed with Cephalosporin use. ?  ? ? ?Patient Measurements: ?Height: '6\' 3"'$  (190.5 cm) ?Weight: (!) 138.3 kg (305 lb) ?IBW/kg (Calculated) : 84.5 ?Heparin Dosing Weight: 115.4kg ? ?Vital Signs: ?Temp: 97.7 ?F (36.5 ?C) (03/29 1116) ?Temp Source: Oral (03/29 1116) ?BP: 113/64 (03/29 1300) ?Pulse Rate: 84 (03/29 1315) ? ?Labs: ?Recent Labs  ?  07/04/21 ?1134  ?HGB 15.0  ?HCT 47.3  ?PLT 225  ?CREATININE 1.14  ?TROPONINIHS 13  ? ? ?Estimated Creatinine Clearance: 91.7 mL/min (by C-G formula based on SCr of 1.14 mg/dL). ? ? ?Medical History: ?Past Medical History:  ?Diagnosis Date  ? Acute medial meniscus tear of right knee   ? Arthritis   ? Dysrhythmia   ? history of AFIB  ? Obesity (BMI 30-39.9) 02/24/2019  ? Old bucket handle tear of lateral meniscus of right knee   ? Testicular failure 02/24/2019  ? Vitamin D deficiency disease 02/24/2019  ? ? ?Medications:  ?ASA pta ? ?Assessment: ?71 year old male hx Afib s/p ablation on home aspirin, presenting in the ED with Afib. ? ?Goal of Therapy:  ?Heparin level 0.3-0.7 units/ml ?Monitor platelets by anticoagulation protocol: Yes ?  ?Plan:  ?Heparin bolus 5000 units ?Heparin drip @ 1600 units/hr = 16 ml/hr ?Heparin level in 6 hours ?Daily HL and CBC ?Monitor for s/sx bleeding ?F/u plans for oral anticoagulation ? ?Harold West ?07/04/2021,1:32 PM ? ? ?

## 2021-07-04 NOTE — Assessment & Plan Note (Addendum)
-  Continue the use of Mirapex. ?

## 2021-07-04 NOTE — H&P (Signed)
?History and Physical  ? ? ?Patient: Harold West TDV:[id number] DOB: [birth year] ?DOA: 07/04/2021 ?DOS: the patient was seen and examined on 07/04/2021 ?PCP: Lindell Spar, MD  ? ?Patient coming from: Home ? ?Chief Complaint: Palpitations, weakness, shortness of breath with activity. ? ?HPI: Harold West is a 71 y.o. male with medical history significant of atrial fibrillation with prior ablation (x2 in [place] over 10 years ago; no longer requiring rate control agents and anticoagulation therapy), class II obesity, vitamin D deficiency, erectile dysfunction and restless leg syndrome; who presented to the emergency department secondary to intermittent episodes of palpitations; patient's symptoms became more frequent and associated with generalized weakness, shortness of breath especially on exertion and heart fluttering.  He requested to be seen by his PCP that given concerns of symptoms recommended for him to come to the emergency department for further evaluation and management.  Patient reports no chest pain, no fever, no nausea, no vomiting, no dysuria, no hematuria, no melena, no hematochezia, no abdominal pain, no focal weakness, no headaches, no blurred vision or any other complaints.  ? ?In the ED chest x-ray demonstrated no acute cardiopulmonary process, patient was negative for COVID and his troponin negative x2. ? ?Patient found with A-fib with RVR and heart rate in the 160s to 170s; initially received a dose of adenosine 6 mg, then 12 mg; 10 mg of IV diltiazem and 2.5 mg of IV Lopressor.  Diltiazem drip was initiated but secondary to low blood pressure was aborted. Cardiology service was contacted with recommendations for the use of digoxin, heparin drip and admission to hospital service with anticipated cardioversion on 07/05/2021. ? ? ? ?Review of Systems: As mentioned in the history of present illness. All other systems reviewed and are negative. ?Past Medical History:  ?Diagnosis Date  ? Acute medial  meniscus tear of right knee   ? Arthritis   ? Dysrhythmia   ? history of AFIB  ? Obesity (BMI 30-39.9) 02/24/2019  ? Old bucket handle tear of lateral meniscus of right knee   ? Testicular failure 02/24/2019  ? Vitamin D deficiency disease 02/24/2019  ? ?Past Surgical History:  ?Procedure Laterality Date  ? COLONOSCOPY N/A 03/10/2018  ? Procedure: COLONOSCOPY;  Surgeon: Daneil Dolin, MD;  [facility];  Service: Endoscopy;  Laterality: N/A;  7:30  ? KNEE ARTHROSCOPY WITH MEDIAL MENISECTOMY Right 01/21/2020  ? Procedure: KNEE ARTHROSCOPY WITH MEDIAL AND LATERAL MENISECTOMY;  Surgeon: Carole Civil, MD;  Location: [facility];  Service: Orthopedics;  Laterality: Right;  ? KNEE SURGERY Right 1987  ? POLYPECTOMY  03/10/2018  ? Procedure: POLYPECTOMY;  Surgeon: Daneil Dolin, MD;  Location[facility];  Service: Endoscopy;;  (colon)  ? Testicle removed Left   ? VASECTOMY  1989  ? ?Social History:  reports that he has never smoked. He has never used smokeless tobacco. He reports current alcohol use. He reports that he does not use drugs. ? ?Allergies  ?Allergen Reactions  ? Amiodarone Rash  ? Penicillins Rash  ?  Has patient had a PCN reaction causing immediate rash, facial/tongue/throat swelling, SOB or lightheadedness with hypotension: Unknown ?Has patient had a PCN reaction causing severe rash involving mucus membranes or skin necrosis: No ?Has patient had a PCN reaction that required hospitalization: No ?Has patient had a PCN reaction occurring within the last 10 years: No ?If all of the above answers are "NO", then may proceed with Cephalosporin use. ?  ? ? ?Family History  ?Problem  Relation Age of Onset  ? Hypertension Mother   ? Diabetes Mother   ? Heart disease Father   ? ? ?Prior to Admission medications   ?Medication Sig Start Date End Date Taking? Authorizing Provider  ?Ascorbic Acid (VITAMIN C) 1000 MG tablet Take 1,000 mg by mouth daily.   Yes [provider]  ?aspirin EC 81 MG  tablet Take 81 mg by mouth daily.   Yes [provider]  ?Calcium Polycarbophil (FIBER-CAPS PO) Take 5 capsules by mouth daily.   Yes [provider]  ?Cholecalciferol (VITAMIN D3) 125 MCG (5000 UT) TABS Take 10,000 Units by mouth daily.    Yes [provider]  ?Glucosamine-Chondroit-Vit C-Mn (GLUCOSAMINE 1500 COMPLEX PO) Take 2 tablets by mouth daily.   Yes [provider]  ?pramipexole (MIRAPEX) 0.125 MG tablet Take 1 tablet (0.125 mg total) by mouth at bedtime. Take 1 tablet 2 to 3 hours before bedtime. 06/22/21 07/22/21 Yes Alric Ran, MD  ?sildenafil (VIAGRA) 50 MG tablet Take 1 tablet (50 mg total) by mouth daily as needed for erectile dysfunction. 06/14/21  Yes Lindell Spar, MD  ?benzonatate (TESSALON) 100 MG capsule Take 1 capsule (100 mg total) by mouth 2 (two) times daily as needed for cough. ?Patient not taking: Reported on 07/04/2021 06/14/21   Lindell Spar, MD  ? ? ?Physical Exam: ?Vitals:  ? 07/04/21 1700 07/04/21 1713 07/04/21 1800 07/04/21 1843  ?BP: (!) 117/53  (!) 156/84   ?Pulse: 82 77 83   ?Resp: '13 18 18   '$ ?Temp:    (!) 97.4 ?F (36.3 ?C)  ?TempSrc:    Oral  ?SpO2: 96% 100% 99%   ?Weight:      ?Height:      ? ?General exam: Alert, awake, oriented x 3; in no major distress.  Reporting not feeling well. ?Respiratory system: Clear to auscultation.  No tachypnea, no wheezing, no using accessory muscles.  Good saturation on room air. ?Cardiovascular system: Irregular irregular; no rubs or gallops.  No JVD on exam. ?Gastrointestinal system: Abdomen is nondistended, soft and nontender. No organomegaly or masses felt. Normal bowel sounds heard. ?Central nervous system: Alert and oriented. No focal neurological deficits. ?Extremities: No C/C/E, +pedal pulses ?Skin: No rashes, no petechiae. ?Psychiatry: Judgement and insight appear normal. Mood & affect appropriate.  ? ?Data Reviewed: ?-Chest x-ray without acute cardiopulmonary process ?-MRSA PCR negative ?-Troponins  x2 negative ?-Respiratory panel negative for COVID and influenza ?-Unremarkable CBC with differential ?-Basic metabolic panel only demonstrating a blood sugar of 107. ?-EKG with positive A-fib with RVR. ? ?Assessment and Plan: ?* Atrial fibrillation with rapid ventricular response ([place]) ?-Patient with prior history of atrial fibrillation s/p 2 ablations in the past; prior to admission not taking any rate control agents or anticoagulation. ?-Presented with A-fib with RVR, weakness and chills with this sensation.  He also expressed intermittent palpitation symptoms. ?-At time of admission heart rate in the 160s consistent with atrial flutter/atrial fibrillation, RVR appreciated on tracings. ?-Negative troponin ?-Normal magnesium, normal potassium levels ?-Will check TSH, phosphorus and 2D echo ?-Heparin drip has been started ?-Case discussed with cardiology service which has recommended digoxin and as needed metoprolol; planning to pursued cardioversion on 07/05/2021. ?-Patient has been admitted to stepdown. ?-Patient with prior history of allergy reaction to the use of amiodarone in the past. ?-Reports no chest pain. ?-will recommend sleep study as an outpatient. ? ?Obesity (BMI 30-39.9) ?-Low calorie diet, increase physical activity and portion control discussed with patient ?-Body mass  index is 38.12 kg/m?.  ? ?RLS (restless legs syndrome) ?-Resume the use of Mirapex. ? ?Vitamin D deficiency disease ?-Continue vitamin D supplementation. ? ? ?CHADSVASC score :1 ? ? ? Advance Care Planning:   Code Status: Full Code  ? ?Consults: Cardiology service ? ?Family Communication: No family at bedside. ? ?Severity of Illness: ?The appropriate patient status for this patient is INPATIENT. Inpatient status is judged to be reasonable and necessary in order to provide the required intensity of service to ensure the patient's safety. The patient's presenting symptoms, physical exam findings, and initial radiographic and laboratory  data in the context of their chronic comorbidities is felt to place them at high risk for further clinical deterioration. Furthermore, it is not anticipated that the patient will be medically stable for [facility]

## 2021-07-04 NOTE — Assessment & Plan Note (Addendum)
-  Patient with prior history of atrial fibrillation s/p 2 ablations in the past; prior to admission not taking any rate control agents or anticoagulation. ?-Presented with A-fib with RVR, weakness and short winded sensation with activity. He also expressed intermittent palpitation symptoms. ?-At time of admission heart rate in the 160s consistent with atrial flutter/atrial fibrillation, RVR appreciated on tracings. ?-Negative troponin ?-No chest pain ?-Normal magnesium, normal potassium levels, normal TSH. ?-2D echo reassuring with preserved ejection fraction, no wall motion abnormalities, no significant valvular disorder. ?-Heparin drip was initially initiated with anticipation for TEE and cardioversion on 07/05/2021; after the use of Lopressor and digoxin as per cardiology recommendation patient self converted to sinus rhythm and remain with rate control. ?-Cardioversion was canceled and recommendations given for patient to be discharged home metoprolol 25 mg twice a day and outpatient follow-up. ?-CHADS2-VASc score 1; will continue treatment with aspirin for secondary prevention. ? ?

## 2021-07-04 NOTE — Telephone Encounter (Signed)
Spoke with Dr Posey Pronto as pt called insurance nurse line and they advised him to call the office. Per patel he advised pt go to ER or urgent care for evaluation due to no openings in the office and pt stated he was having sob advised pt and he was on his way to ER with verbal understanding  ?

## 2021-07-04 NOTE — Assessment & Plan Note (Addendum)
-  Low calorie diet, increase physical activity and portion control discussed with patient ?-Body mass index is 38.99 kg/m?Marland Kitchen  ?

## 2021-07-04 NOTE — Assessment & Plan Note (Signed)
Continue vitamin D supplementation 

## 2021-07-04 NOTE — Telephone Encounter (Signed)
Pt's wife, Harold West pt is in ED at [facility]. Elevated heart rate, up to 166 when got to the ED. Giving him medication to lower his heart rate. Would like a call from the nurse to discuss if pramipexole (MIRAPEX) 0.125 MG tablet could cause increase in heart rate. ?

## 2021-07-04 NOTE — Telephone Encounter (Signed)
I called pt's wife left vm for call. Per epic he is currently at [facility] receiving treatment.  ?

## 2021-07-04 NOTE — ED Triage Notes (Addendum)
Pt in c/o increased HR x 7-10 days with reported HR fluctuations from 70-160bpm, hx of ablations x 2 for afib and now does not require medications, denies CP, reports intermittent SOB, denies n/v/d, reports recently taking new medication for restless leg, A&O x4 ?

## 2021-07-04 NOTE — Telephone Encounter (Signed)
Can pt wait until April 5 or would you like to see him sooner ?

## 2021-07-04 NOTE — Progress Notes (Signed)
ANTICOAGULATION CONSULT NOTE - Initial Consult ? ?Pharmacy Consult for Heparin ?Indication: atrial fibrillation ? ?Allergies  ?Allergen Reactions  ? Amiodarone Rash  ? Penicillins Rash  ?  Has patient had a PCN reaction causing immediate rash, facial/tongue/throat swelling, SOB or lightheadedness with hypotension: Unknown ?Has patient had a PCN reaction causing severe rash involving mucus membranes or skin necrosis: No ?Has patient had a PCN reaction that required hospitalization: No ?Has patient had a PCN reaction occurring within the last 10 years: No ?If all of the above answers are "NO", then may proceed with Cephalosporin use. ?  ? ? ?Patient Measurements: ?Height: '6\' 3"'$  (190.5 cm) ?Weight: (!) 141.5 kg (311 lb 15.2 oz) ?IBW/kg (Calculated) : 84.5 ?Heparin Dosing Weight: 115.4kg ? ?Vital Signs: ?Temp: 97.5 ?F (36.4 ?C) (03/29 2000) ?Temp Source: Oral (03/29 2000) ?BP: 145/74 (03/29 2000) ?Pulse Rate: 83 (03/29 2000) ? ?Labs: ?Recent Labs  ?  07/04/21 ?1134 07/04/21 ?1326 07/04/21 ?2021  ?HGB 15.0  --   --   ?HCT 47.3  --   --   ?PLT 225  --   --   ?HEPARINUNFRC  --   --  0.28*  ?CREATININE 1.14  --   --   ?TROPONINIHS 13 11  --   ? ? ? ?Estimated Creatinine Clearance: 92.8 mL/min (by C-G formula based on SCr of 1.14 mg/dL). ? ? ?Medical History: ?Past Medical History:  ?Diagnosis Date  ? Acute medial meniscus tear of right knee   ? Arthritis   ? Dysrhythmia   ? history of AFIB  ? Obesity (BMI 30-39.9) 02/24/2019  ? Old bucket handle tear of lateral meniscus of right knee   ? Testicular failure 02/24/2019  ? Vitamin D deficiency disease 02/24/2019  ? ? ?Medications:  ?ASA pta ? ?Assessment: ?71 year old male hx Afib s/p ablation on home aspirin, presenting in the ED with Afib. ? ?PM update: HL 0.28, no issues with infusion or bleeding per RN ? ?Goal of Therapy:  ?Heparin level 0.3-0.7 units/ml ?Monitor platelets by anticoagulation protocol: Yes ?  ?Plan:  ?Increase Heparin drip to 1750 units/hr = 17.5  ml/hr ?Heparin level in 6 hours ?Daily HL and CBC ?Monitor for s/sx bleeding ?F/u plans for oral anticoagulation ? ?Welles Walthall A. Levada Dy, PharmD, BCPS, FNKF ?Clinical Pharmacist ?[place] ?Please utilize Amion for appropriate phone number to reach the unit pharmacist ([place]) ? ?07/04/2021,9:14 PM ? ? ?

## 2021-07-04 NOTE — Telephone Encounter (Signed)
Scheduled appt with Alvira Monday, NP on 04.05.2023, is this okay? ?

## 2021-07-05 ENCOUNTER — Other Ambulatory Visit (HOSPITAL_COMMUNITY): Payer: Medicare HMO

## 2021-07-05 ENCOUNTER — Encounter (HOSPITAL_COMMUNITY): Payer: Self-pay | Admitting: Certified Registered"

## 2021-07-05 ENCOUNTER — Inpatient Hospital Stay (HOSPITAL_BASED_OUTPATIENT_CLINIC_OR_DEPARTMENT_OTHER): Payer: Medicare HMO

## 2021-07-05 ENCOUNTER — Encounter: Payer: Self-pay | Admitting: *Deleted

## 2021-07-05 DIAGNOSIS — I4891 Unspecified atrial fibrillation: Secondary | ICD-10-CM

## 2021-07-05 DIAGNOSIS — E559 Vitamin D deficiency, unspecified: Secondary | ICD-10-CM | POA: Diagnosis not present

## 2021-07-05 DIAGNOSIS — E669 Obesity, unspecified: Secondary | ICD-10-CM | POA: Diagnosis not present

## 2021-07-05 DIAGNOSIS — G2581 Restless legs syndrome: Secondary | ICD-10-CM | POA: Diagnosis not present

## 2021-07-05 DIAGNOSIS — I4892 Unspecified atrial flutter: Secondary | ICD-10-CM | POA: Diagnosis not present

## 2021-07-05 LAB — CBC
HCT: 40.7 % (ref 39.0–52.0)
Hemoglobin: 12.6 g/dL — ABNORMAL LOW (ref 13.0–17.0)
MCH: 27.3 pg (ref 26.0–34.0)
MCHC: 31 g/dL (ref 30.0–36.0)
MCV: 88.1 fL (ref 80.0–100.0)
Platelets: 168 10*3/uL (ref 150–400)
RBC: 4.62 MIL/uL (ref 4.22–5.81)
RDW: 14.3 % (ref 11.5–15.5)
WBC: 7 10*3/uL (ref 4.0–10.5)
nRBC: 0 % (ref 0.0–0.2)

## 2021-07-05 LAB — ECHOCARDIOGRAM COMPLETE
AR max vel: 2.98 cm2
AV Area VTI: 2.55 cm2
AV Area mean vel: 2.44 cm2
AV Mean grad: 4 mmHg
AV Peak grad: 6.8 mmHg
Ao pk vel: 1.3 m/s
Area-P 1/2: 4.44 cm2
Calc EF: 58.5 %
Height: 75 in
MV VTI: 3.49 cm2
S' Lateral: 3.9 cm
Single Plane A2C EF: 57.3 %
Single Plane A4C EF: 60.2 %
Weight: 4991.21 oz

## 2021-07-05 LAB — BASIC METABOLIC PANEL
Anion gap: 6 (ref 5–15)
BUN: 18 mg/dL (ref 8–23)
CO2: 23 mmol/L (ref 22–32)
Calcium: 8.8 mg/dL — ABNORMAL LOW (ref 8.9–10.3)
Chloride: 110 mmol/L (ref 98–111)
Creatinine, Ser: 0.8 mg/dL (ref 0.61–1.24)
GFR, Estimated: >60 mL/min (ref >60)
Glucose, Bld: 92 mg/dL (ref 70–99)
Potassium: 4.2 mmol/L (ref 3.5–5.1)
Sodium: 139 mmol/L (ref 135–145)

## 2021-07-05 LAB — HEPARIN LEVEL (UNFRACTIONATED): Heparin Unfractionated: 0.46 IU/mL (ref 0.30–0.70)

## 2021-07-05 SURGERY — CARDIOVERSION
Anesthesia: Monitor Anesthesia Care

## 2021-07-05 MED ORDER — METOPROLOL TARTRATE 25 MG PO TABS
25.0000 mg | ORAL_TABLET | Freq: Two times a day (BID) | ORAL | Status: DC
  Administered 2021-07-05: 25 mg via ORAL
  Filled 2021-07-05: qty 1

## 2021-07-05 MED ORDER — PERFLUTREN LIPID MICROSPHERE
1.0000 mL | INTRAVENOUS | Status: AC | PRN
  Administered 2021-07-05: 5 mL via INTRAVENOUS

## 2021-07-05 MED ORDER — METOPROLOL TARTRATE 25 MG PO TABS: 25.0000 mg | ORAL_TABLET | Freq: Two times a day (BID) | ORAL | 2 refills | Status: DC

## 2021-07-05 NOTE — Telephone Encounter (Signed)
I called the pt's wife back and we were able to discuss call. Pt went to ER yesterday due to elevated HR (160+) and shortness of breath. Per notes from hospital and conversation with wife pt has been dx with a-fib. ? ?Prior to discussing with cardiologist yesterday she thought the symptoms were due to his pramipexole. Cardiologist felt like symptoms were not related. He is scheduled for cardioversion today per epic notes.  ? ?Pt's wife wanted to let Dr. April Manson know about this event but at this time she did not have any specific questions.  ?

## 2021-07-05 NOTE — Care Management Obs Status (Signed)
MEDICARE OBSERVATION STATUS NOTIFICATION ? ? ?Patient Details  ?Name: Harold West ?MRN: [id number] ?Date of Birth: 27-Oct-1950 ? ? ?Medicare Observation Status Notification Given:  Yes ? ? ? ?Boneta Lucks, RN ?07/05/2021, 2:28 PM ?

## 2021-07-05 NOTE — Progress Notes (Signed)
*  PRELIMINARY RESULTS* ?Echocardiogram ?2D Echocardiogram has been performed. ? ?Harold West ?07/05/2021, 9:35 AM ?

## 2021-07-05 NOTE — Progress Notes (Signed)
?  Transition of Care (TOC) Screening Note ? ? ?Patient Details  ?Name: Harold West ?Date of Birth: [birth year] ? ? ?Transition of Care (TOC) CM/SW Contact:    ?Boneta Lucks, RN ?Phone Number: ?07/05/2021, 2:32 PM ? ? ? ?Transition of Care Department [facility]) has reviewed patient and no TOC needs have been identified at this time. We will continue to monitor patient advancement through interdisciplinary progression rounds. If new patient transition needs arise, please place a TOC consult. ? ? ?

## 2021-07-05 NOTE — Telephone Encounter (Signed)
Thank you :)

## 2021-07-05 NOTE — Care Management CC44 (Signed)
Condition Code 44 Documentation Completed ? ?Patient Details  ?Name: Harold West ?MRN: [id number] ?Date of Birth: 07-Jan-1951 ? ? ?Condition Code 44 given:  Yes ?Patient signature on Condition Code 44 notice:  Yes ?Documentation of 2 MD's agreement:  Yes ?Code 44 added to claim:  Yes ? ? ? ?Boneta Lucks, RN ?07/05/2021, 2:28 PM ? ?

## 2021-07-05 NOTE — Discharge Summary (Signed)
?Physician Discharge Summary ?  ?Patient: Harold West MRN: [id number] DOB: [birth year]  ?Admit date:     07/04/2021  ?Discharge date: 07/05/21  ?Discharge Physician: Barton Dubois  ? ?PCP: Lindell Spar, MD  ? ?Recommendations at discharge:  ?Repeat basic metabolic panel to follow ultralights and renal function ?Continue assisting patient with weight loss management. ?Given atrial fibrillation and history of class II obesity will recommend a sleep study as an outpatient (if not already done). ? ?Discharge Diagnoses: ?Principal Problem: ?  Atrial fibrillation with rapid ventricular response ([place]) ?Active Problems: ?  Obesity (BMI 30-39.9) ?  RLS (restless legs syndrome) ?  Vitamin D deficiency disease ? ?Resolved Problems: ? -A-fib with RVR; patient self converted to sinus rhythm.  Rate control at discharge. ? ?Hospital Course: ?Harold West is a 71 y.o. male with medical history significant of atrial fibrillation with prior ablation (x2 in [place] over 10 years ago; no longer requiring rate control agents and anticoagulation therapy), class II obesity, vitamin D deficiency, erectile dysfunction and restless leg syndrome; who presented to the emergency department secondary to intermittent episodes of palpitations; patient's symptoms became more frequent and associated with generalized weakness, shortness of breath especially on exertion and heart fluttering.  He requested to be seen by his PCP that given concerns of symptoms recommended for him to come to the emergency department for further evaluation and management.  Patient reports no chest pain, no fever, no nausea, no vomiting, no dysuria, no hematuria, no melena, no hematochezia, no abdominal pain, no focal weakness, no headaches, no blurred vision or any other complaints.  ?  ?In the ED chest x-ray demonstrated no acute cardiopulmonary process, patient was negative for COVID and his troponin negative x2. ?  ?Patient found with A-fib with RVR and heart rate in  the 160s to 170s; initially received a dose of adenosine 6 mg, then 12 mg; 10 mg of IV diltiazem and 2.5 mg of IV Lopressor.  Diltiazem drip was initiated but secondary to low blood pressure was aborted. Cardiology service was contacted with recommendations for the use of digoxin, heparin drip and admission to hospital service with anticipated cardioversion on 07/05/2021. ? ?Assessment and Plan: ?* Atrial fibrillation with rapid ventricular response (H[facility]) ?-Patient with prior history of atrial fibrillation s/p 2 ablations in the past; prior to admission not taking any rate control agents or anticoagulation. ?-Presented with A-fib with RVR, weakness and short winded sensation with activity. He also expressed intermittent palpitation symptoms. ?-At time of admission heart rate in the 160s consistent with atrial flutter/atrial fibrillation, RVR appreciated on tracings. ?-Negative troponin ?-No chest pain ?-Normal magnesium, normal potassium levels, normal TSH. ?-2D echo reassuring with preserved ejection fraction, no wall motion abnormalities, no significant valvular disorder. ?-Heparin drip was initially initiated with anticipation for TEE and cardioversion on 07/05/2021; after the use of Lopressor and digoxin as per cardiology recommendation patient self converted to sinus rhythm and remain with rate control. ?-Cardioversion was canceled and recommendations given for patient to be discharged home metoprolol 25 mg twice a day and outpatient follow-up. ?-CHADS2-VASc score 1; will continue treatment with aspirin for secondary prevention. ? ? ?Obesity (BMI 30-39.9) ?-Low calorie diet, increase physical activity and portion control discussed with patient ?-Body mass index is 38.99 kg/m?.  ? ?RLS (restless legs syndrome) ?-Continue the use of Mirapex. ? ?Vitamin D deficiency disease ?-Continue vitamin D supplementation. ? ? ?Consultants: Cardiology service. ?Procedures performed: See below for x-ray report; 2D echo  demonstrating preserved ejection fraction, no  wall motion abnormalities and not significant valvular disorder. ?Disposition: Home ?Diet recommendation:  ?Discharge Diet Orders (From admission, onward)  ? ?  Start     Ordered  ? 07/05/21 0000  Diet - low sodium heart healthy       ? 07/05/21 1354  ? ?  ?  ? ?  ? ?Cardiac diet ?DISCHARGE MEDICATION: ?Allergies as of 07/05/2021   ? ?   Reactions  ? Amiodarone Rash  ? Penicillins Rash  ? Has patient had a PCN reaction causing immediate rash, facial/tongue/throat swelling, SOB or lightheadedness with hypotension: Unknown ?Has patient had a PCN reaction causing severe rash involving mucus membranes or skin necrosis: No ?Has patient had a PCN reaction that required hospitalization: No ?Has patient had a PCN reaction occurring within the last 10 years: No ?If all of the above answers are "NO", then may proceed with Cephalosporin use.  ? ?  ? ?  ?Medication List  ?  ? ?STOP taking these medications   ? ?benzonatate 100 MG capsule ?Commonly known as: TESSALON ?  ? ?  ? ?TAKE these medications   ? ?aspirin EC 81 MG tablet ?Take 81 mg by mouth daily. ?  ?FIBER-CAPS PO ?Take 5 capsules by mouth daily. ?  ?GLUCOSAMINE 1500 COMPLEX PO ?Take 2 tablets by mouth daily. ?  ?metoprolol tartrate 25 MG tablet ?Commonly known as: LOPRESSOR ?Take 1 tablet (25 mg total) by mouth 2 (two) times daily. ?  ?pramipexole 0.125 MG tablet ?Commonly known as: Mirapex ?Take 1 tablet (0.125 mg total) by mouth at bedtime. Take 1 tablet 2 to 3 hours before bedtime. ?  ?sildenafil 50 MG tablet ?Commonly known as: VIAGRA ?Take 1 tablet (50 mg total) by mouth daily as needed for erectile dysfunction. ?  ?vitamin C 1000 MG tablet ?Take 1,000 mg by mouth daily. ?  ?Vitamin D3 125 MCG (5000 UT) Tabs ?Take 10,000 Units by mouth daily. ?  ? ?  ? ? Follow-up Information   ? ? Imogene Burn, PA-C Follow up on 08/13/2021.   ?Specialty: Cardiology ?Why: Cardiology Follow-up on 08/13/2021 at 2:00 PM. ?Contact  information: ?[address] ?[place] [address] ?[phone] ? ? ?  ?  ? ? Lindell Spar, MD. Schedule an appointment as soon as possible for a visit in 10 day(s).   ?Specialty: Internal Medicine ?Contact information: ?[address] ?[place] [address] ?[phone] ? ? ?  ?  ? ?  ?  ? ?  ? ?Discharge Exam: ?Filed Weights  ? 07/04/21 1149 07/04/21 1430  ?Weight: (!) 138.3 kg (!) 141.5 kg  ? ?General exam: Alert, awake, oriented x 3; no chest pain, no nausea, no vomiting, no shortness of breath, no palpitations. ?Respiratory system: Clear to auscultation. Respiratory effort normal.  No using accessory muscles; good saturation on room air. ?Cardiovascular system:RRR. No murmurs, rubs, gallops or JVD. ?Gastrointestinal system: Abdomen is nondistended, soft and nontender. No organomegaly or masses felt. Normal bowel sounds heard. ?Central nervous system: Alert and oriented. No focal neurological deficits. ?Extremities: No cyanosis or clubbing. ?Skin: No petechiae. ?Psychiatry: Judgement and insight appear normal. Mood & affect appropriate.  ? ? ?Condition at discharge: Stable and improved. ? ?The results of significant diagnostics from this hospitalization (including imaging, microbiology, ancillary and laboratory) are listed below for reference.  ? ?Imaging Studies: ?DG Chest Port 1 View ? ?Result Date: 07/04/2021 ?CLINICAL DATA:  Weakness. EXAM: PORTABLE CHEST 1 VIEW COMPARISON:  None. FINDINGS: The heart size and  mediastinal contours are within normal limits. Both lungs are clear. The visualized skeletal structures are unremarkable. IMPRESSION: No active disease. Electronically Signed   By: Marijo Conception M.D.   On: 07/04/2021 11:54  ? ?ECHOCARDIOGRAM COMPLETE ? ?Result Date: 07/05/2021 ?   ECHOCARDIOGRAM REPORT   Patient Name:   THAMAS APPLEYARD Date of Exam: 07/05/2021 Medical Rec #:  [id number]    Height:       75.0 in Accession #:    [id number]   Weight:       311.9 lb Date of Birth:  1950/12/05    BSA:           2.651 m? Patient Age:    62 years     BP:           143/79 mmHg Patient Gender: M            HR:           78 bpm. Exam Location:  [facility] Procedure: 2D Echo, Cardiac Doppler, Color Doppler and Intracardiac

## 2021-07-05 NOTE — Progress Notes (Signed)
? ?Progress Note ? ?Patient Name: Harold West ?Date of Encounter: 07/05/2021 ? ?[facility] Cardiologist: New ? ?Subjective  ? ?No complaints ? ?Inpatient Medications  ?  ?Scheduled Meds: ? Chlorhexidine Gluconate Cloth  6 each Topical Daily  ? sodium chloride flush  3 mL Intravenous Q12H  ? ?Continuous Infusions: ? sodium chloride    ? heparin 1,750 Units/hr (07/05/21 0258)  ? ?PRN Meds: ?sodium chloride, acetaminophen **OR** acetaminophen, metoprolol tartrate, ondansetron **OR** ondansetron (ZOFRAN) IV, sodium chloride flush  ? ?Vital Signs  ?  ?Vitals:  ? 07/04/21 1900 07/04/21 2000 07/05/21 0700 07/05/21 0720  ?BP: 134/73 (!) 145/74 123/77   ?Pulse: 83 83 66 78  ?Resp: '17 14 14 13  '$ ?Temp:  (!) 97.5 ?F (36.4 ?C)    ?TempSrc:  Oral    ?SpO2: 99% 100% 99% 100%  ?Weight:      ?Height:      ? ? ?Intake/Output Summary (Last 24 hours) at 07/05/2021 0751 ?Last data filed at 07/05/2021 0600 ?Gross per 24 hour  ?Intake 758.24 ml  ?Output --  ?Net 758.24 ml  ? ? ?  07/04/2021  ?  2:30 PM 07/04/2021  ? 11:49 AM 06/20/2021  ? 10:19 AM  ?Last 3 Weights  ?Weight (lbs) 311 lb 15.2 oz 305 lb 312 lb  ?Weight (kg) 141.5 kg 138.347 kg 141.522 kg  ?   ? ?Telemetry  ?  ?SR - Personally Reviewed ? ?ECG  ?  ?N/a - Personally Reviewed ? ?Physical Exam  ? ?GEN: No acute distress.   ?Neck: No JVD ?Cardiac: RRR, no murmurs, rubs, or gallops.  ?Respiratory: Clear to auscultation bilaterally. ?GI: Soft, nontender, non-distended  ?MS: No edema; No deformity. ?Neuro:  Nonfocal  ?Psych: Normal affect  ? ?Labs  ?  ?High Sensitivity Troponin:   ?Recent Labs  ?Lab 07/04/21 ?1134 07/04/21 ?1326  ?TROPONINIHS 13 11  ?   ?Chemistry ?Recent Labs  ?Lab 07/04/21 ?1134 07/05/21 ?0352  ?NA 137 139  ?K 3.9 4.2  ?CL 108 110  ?CO2 26 23  ?GLUCOSE 107* 92  ?BUN 21 18  ?CREATININE 1.14 0.80  ?CALCIUM 9.1 8.8*  ?MG 2.4  --   ?PROT 7.1  --   ?ALBUMIN 4.2  --   ?AST 26  --   ?ALT 27  --   ?ALKPHOS 50  --   ?BILITOT 1.2  --   ?GFRNONAA >60 >60  ?ANIONGAP 3* 6  ?   ?Lipids No results for input(s): CHOL, TRIG, HDL, LABVLDL, LDLCALC, CHOLHDL in the last 168 hours.  ?Hematology ?Recent Labs  ?Lab 07/04/21 ?1134 07/05/21 ?0352  ?WBC 8.6 7.0  ?RBC 5.37 4.62  ?HGB 15.0 12.6*  ?HCT 47.3 40.7  ?MCV 88.1 88.1  ?MCH 27.9 27.3  ?MCHC 31.7 31.0  ?RDW 14.5 14.3  ?PLT 225 168  ? ?Thyroid  ?Recent Labs  ?Lab 07/04/21 ?1342  ?TSH 1.156  ?  ?BNPNo results for input(s): BNP, PROBNP in the last 168 hours.  ?DDimer No results for input(s): DDIMER in the last 168 hours.  ? ?Radiology  ?  ?DG Chest Port 1 View ? ?Result Date: 07/04/2021 ?CLINICAL DATA:  Weakness. EXAM: PORTABLE CHEST 1 VIEW COMPARISON:  None. FINDINGS: The heart size and mediastinal contours are within normal limits. Both lungs are clear. The visualized skeletal structures are unremarkable. IMPRESSION: No active disease. Electronically Signed   By: Marijo Conception M.D.   On: 07/04/2021 11:54   ? ?Cardiac Studies  ? ? ? ?Patient Profile  ?   ?  Harold West is a 71 y.o. male with a hx of afib s/p ablation 10 years ago in [place]  who is being seen 07/04/2021 for the evaluation of tachycardia at the request of Dr Dyann Kief. ? ?Assessment & Plan  ?  ?1.Aflutter ?- reported history of prior ablations 10 years ago,I do not know the full history.He believes he had an afib ablation and later aflutter ablation. Had done well since that time until just this week.  Has not been on anticoag ?- presents with aflutter with RVR. Some low bp's with initial attempts at rate control on dilt gtt, given IV digoxin 0.'5mg'$  x 1 ? ?-plan was for TEE/DCCV today however patient self converted last night ?- will start lopressor '25mg'$  bid. ?  ? -CHADS2Vasc score would appear to just be 1 based on age and known medical history. Since no long plans for DCCV will stop heparin gtt, he does not require anticoag at this point in time unless new findings on echo.  ? ? ?From family the [facility] physicians who did prior ablation. Kristopher Oppenheim, MD on August 7,2013....&  second was  November 25, 2011. Done at [facility] I have asked our clinic staff to request.  ? ? ? ?For questions or updates, please contact [place] ?Please consult [email or web address] for contact info under  ? ?  ?   ?Signed, ?Carlyle Dolly, MD  ?07/05/2021, 7:51 AM   ? ?

## 2021-07-05 NOTE — Progress Notes (Signed)
ANTICOAGULATION CONSULT NOTE  ? ?Pharmacy Consult for Heparin ?Indication: atrial fibrillation ? ?Allergies  ?Allergen Reactions  ? Amiodarone Rash  ? Penicillins Rash  ?  Has patient had a PCN reaction causing immediate rash, facial/tongue/throat swelling, SOB or lightheadedness with hypotension: Unknown ?Has patient had a PCN reaction causing severe rash involving mucus membranes or skin necrosis: No ?Has patient had a PCN reaction that required hospitalization: No ?Has patient had a PCN reaction occurring within the last 10 years: No ?If all of the above answers are "NO", then may proceed with Cephalosporin use. ?  ? ? ?Patient Measurements: ?Height: '6\' 3"'$  (190.5 cm) ?Weight: (!) 141.5 kg (311 lb 15.2 oz) ?IBW/kg (Calculated) : 84.5 ?Heparin Dosing Weight: 115.4kg ? ?Vital Signs: ?Temp: 97.5 ?F (36.4 ?C) (03/29 2000) ?Temp Source: Oral (03/29 2000) ?BP: 145/74 (03/29 2000) ?Pulse Rate: 83 (03/29 2000) ? ?Labs: ?Recent Labs  ?  07/04/21 ?1134 07/04/21 ?1326 07/04/21 ?2021 07/05/21 ?0352  ?HGB 15.0  --   --  12.6*  ?HCT 47.3  --   --  40.7  ?PLT 225  --   --  168  ?HEPARINUNFRC  --   --  0.28* 0.46  ?CREATININE 1.14  --   --   --   ?TROPONINIHS 13 11  --   --   ? ? ? ?Estimated Creatinine Clearance: 92.8 mL/min (by C-G formula based on SCr of 1.14 mg/dL). ? ? ?Assessment: ?71 year old male hx Afib s/p ablation on home aspirin, presenting in the ED with Afib. ? ?Heparin level therapeutic (0.46) on infusion at 1750 units/hr. No bleeding noted. ? ?Goal of Therapy:  ?Heparin level 0.3-0.7 units/ml ?Monitor platelets by anticoagulation protocol: Yes ?  ?Plan:  ?Continue heparin at 1750 units/hr ?F/u 6hr confirmatory heparin level ? ?Sherlon Handing, PharmD, BCPS ?Please see amion for complete clinical pharmacist phone list ?07/05/2021,5:01 AM ? ? ?

## 2021-07-06 ENCOUNTER — Telehealth: Payer: Self-pay | Admitting: *Deleted

## 2021-07-06 ENCOUNTER — Telehealth: Payer: Self-pay

## 2021-07-06 NOTE — Telephone Encounter (Signed)
Harold West, pt's spouse called in to confirm Wednesday's appt. ?

## 2021-07-06 NOTE — Telephone Encounter (Signed)
Transition Care Management Follow-up Telephone Call ?Date of discharge and from where: 07-05-21 [facility]  ?How have you been since you were released from the hospital? High heart rate ?Any questions or concerns? No ? ?Items Reviewed: ?Did the pt receive and understand the discharge instructions provided? Yes  ?Medications obtained and verified? Yes  ?Other? No  ?Any new allergies since your discharge? No  ?Dietary orders reviewed? Yes ?Do you have support at home? Yes  ? ?Home Care and Equipment/Supplies: ?Were home health services ordered? no ?If so, what is the name of the agency? NA  ?Has the agency set up a time to come to the patient's home? no ?Were any new equipment or medical supplies ordered?  No ?What is the name of the medical supply agency? NA ?Were you able to get the supplies/equipment? not applicable ?Do you have any questions related to the use of the equipment or supplies? No ? ?Functional Questionnaire: (I = Independent and D = Dependent) ?ADLs: i ? ?Bathing/Dressing- i ? ?Meal Prep- i ? ?Eating- i ? ?Maintaining continence- i ? ?Transferring/Ambulation- i ? ?Managing Meds- i ? ?Follow up appointments reviewed: ? ?PCP Hospital f/u appt confirmed? Yes  Scheduled to see 07-11-21 on [place] @ 9:40. ?[facility] f/u appt confirmed? No ?Are transportation arrangements needed? Yes  ?If their condition worsens, is the pt aware to call PCP or go to the Emergency Dept.? Yes ?Was the patient provided with contact information for the PCP's office or ED? Yes ?Was to pt encouraged to call back with questions or concerns? Yes ? ?

## 2021-07-06 NOTE — Telephone Encounter (Signed)
Transition Care Management Unsuccessful Follow-up Telephone Call ? ?Date of discharge and from where:  07-05-21 Harold West   ? ?Attempts:  1st Attempt ? ?Reason for unsuccessful TCM follow-up call:  Left voice message ? ? ? ?

## 2021-07-07 NOTE — ED Provider Notes (Signed)
?Harold West ?Provider Note ? ? ?CSN: [id number] ?Arrival date & time: 07/04/21  1108 ? ?  ? ?History ? ?No chief complaint on file. ? ? ?Harold West is a 71 y.o. male. ? ?Patient presents with irregular heartbeat.  Patient has a history of atrial fibs.  But has had 2 ablations in  ? ?The history is provided by the patient and medical records. No language interpreter was used.  ?Palpitations ?Palpitations quality:  Irregular ?Onset quality:  Sudden ?Timing:  Constant ?Progression:  Worsening ?Chronicity:  Recurrent ?Context: not anxiety   ?Relieved by:  Nothing ?Worsened by:  Nothing ?Ineffective treatments:  None tried ?Associated symptoms: no back pain, no chest pain and no cough   ?Risk factors: no diabetes mellitus   ? ?  ? ?Home Medications ?Prior to Admission medications   ?Medication Sig Start Date End Date Taking? Authorizing Provider  ?Ascorbic Acid (VITAMIN C) 1000 MG tablet Take 1,000 mg by mouth daily.   Yes [provider]  ?aspirin EC 81 MG tablet Take 81 mg by mouth daily.   Yes [provider]  ?Calcium Polycarbophil (FIBER-CAPS PO) Take 5 capsules by mouth daily.   Yes [provider]  ?Cholecalciferol (VITAMIN D3) 125 MCG (5000 UT) TABS Take 10,000 Units by mouth daily.    Yes [provider]  ?Glucosamine-Chondroit-Vit C-Mn (GLUCOSAMINE 1500 COMPLEX PO) Take 2 tablets by mouth daily.   Yes [provider]  ?pramipexole (MIRAPEX) 0.125 MG tablet Take 1 tablet (0.125 mg total) by mouth at bedtime. Take 1 tablet 2 to 3 hours before bedtime. 06/22/21 07/22/21 Yes Alric Ran, MD  ?sildenafil (VIAGRA) 50 MG tablet Take 1 tablet (50 mg total) by mouth daily as needed for erectile dysfunction. 06/14/21  Yes Lindell Spar, MD  ?metoprolol tartrate (LOPRESSOR) 25 MG tablet Take 1 tablet (25 mg total) by mouth 2 (two) times daily. 07/05/21   Barton Dubois, MD  ?   ? ?Allergies    ?Amiodarone and Penicillins   ? ?Review of Systems    ?Review of Systems  ?Constitutional:  Negative for appetite change and fatigue.  ?HENT:  Negative for congestion, ear discharge and sinus pressure.   ?Eyes:  Negative for discharge.  ?Respiratory:  Negative for cough.   ?Cardiovascular:  Positive for palpitations. Negative for chest pain.  ?Gastrointestinal:  Negative for abdominal pain and diarrhea.  ?Genitourinary:  Negative for frequency and hematuria.  ?Musculoskeletal:  Negative for back pain.  ?Skin:  Negative for rash.  ?Neurological:  Negative for seizures and headaches.  ?Psychiatric/Behavioral:  Negative for hallucinations.   ? ?Physical Exam ?Updated Vital Signs ?BP 134/76   Pulse 60   Temp (!) 97.5 ?F (36.4 ?C) (Oral)   Resp 15   Ht '6\' 3"'$  (1.905 m)   Wt (!) 141.5 kg   SpO2 100%   BMI 38.99 kg/m?  ?Physical Exam ?Vitals and nursing note reviewed.  ?Constitutional:   ?   Appearance: He is well-developed.  ?HENT:  ?   Head: Normocephalic.  ?   Nose: Nose normal.  ?Eyes:  ?   General: No scleral icterus. ?   Conjunctiva/sclera: Conjunctivae normal.  ?Neck:  ?   Thyroid: No thyromegaly.  ?Cardiovascular:  ?   Rate and Rhythm: Tachycardia present. Rhythm irregular.  ?   Heart sounds: No murmur heard. ?  No friction rub. No gallop.  ?Pulmonary:  ?   Breath sounds: No stridor. No wheezing or rales.  ?Chest:  ?  Chest wall: No tenderness.  ?Abdominal:  ?   General: There is no distension.  ?   Tenderness: There is no abdominal tenderness. There is no rebound.  ?Musculoskeletal:     ?   General: Normal range of motion.  ?   Cervical back: Neck supple.  ?Lymphadenopathy:  ?   Cervical: No cervical adenopathy.  ?Skin: ?   Findings: No erythema or rash.  ?Neurological:  ?   Mental Status: He is alert and oriented to person, place, and time.  ?   Motor: No abnormal muscle tone.  ?   Coordination: Coordination normal.  ?Psychiatric:     ?   Behavior: Behavior normal.  ? ? ?ED Results / Procedures / Treatments   ?Labs ?(all labs ordered are listed, but only  abnormal results are displayed) ?Labs Reviewed  ?COMPREHENSIVE METABOLIC PANEL - Abnormal; Notable for the following components:  ?    Result Value  ? Glucose, Bld 107 (*)   ? Anion gap 3 (*)   ? All other components within normal limits  ?HEPARIN LEVEL (UNFRACTIONATED) - Abnormal; Notable for the following components:  ? Heparin Unfractionated 0.28 (*)   ? All other components within normal limits  ?BASIC METABOLIC PANEL - Abnormal; Notable for the following components:  ? Calcium 8.8 (*)   ? All other components within normal limits  ?CBC - Abnormal; Notable for the following components:  ? Hemoglobin 12.6 (*)   ? All other components within normal limits  ?RESP PANEL BY RT-PCR (FLU A&B, COVID) ARPGX2  ?MRSA NEXT GEN BY PCR, NASAL  ?CBC WITH DIFFERENTIAL/PLATELET  ?MAGNESIUM  ?HIV ANTIBODY (ROUTINE TESTING W REFLEX)  ?PHOSPHORUS  ?TSH  ?HEPARIN LEVEL (UNFRACTIONATED)  ?TROPONIN I (HIGH SENSITIVITY)  ?TROPONIN I (HIGH SENSITIVITY)  ? ? ?EKG ?EKG Interpretation ? ?Date/Time:  Wednesday July 04 2021 12:21:13 EDT ?Ventricular Rate:  111 ?PR Interval:    ?QRS Duration: 91 ?QT Interval:  364 ?QTC Calculation: 495 ?R Axis:   -69 ?Text Interpretation: Atrial flutter with predominant 3:1 AV block Left anterior fascicular block Low voltage, precordial leads Consider right ventricular hypertrophy Borderline T abnormalities, anterior leads Borderline prolonged QT interval slower than prior Confirmed by Antony Blackbird [phone]) on 07/05/2021 12:06:02 PM ? ?Radiology ?No results found. ? ?Procedures ?Procedures  ? ? ?Medications Ordered in ED ?Medications  ?perflutren lipid microspheres (DEFINITY) IV suspension (5 mLs Intravenous Given 07/05/21 0920)  ?adenosine (ADENOCARD) 6 MG/2ML injection (6 mg  Given 07/04/21 1126)  ?adenosine (ADENOCARD) 6 MG/2ML injection (12 mg  Given 07/04/21 1129)  ?diltiazem (CARDIZEM) injection 10 mg (10 mg Intravenous Given 07/04/21 1136)  ?metoprolol tartrate (LOPRESSOR) injection 5 mg (2.5 mg  Intravenous Given 07/04/21 1203)  ?digoxin (LANOXIN) 0.25 MG/ML injection 0.5 mg (0.5 mg Intravenous Given 07/04/21 1241)  ?heparin bolus via infusion 4,000 Units (4,000 Units Intravenous Bolus from Bag 07/04/21 1411)  ? ? ?ED Course/ Medical Decision Making/ A&P ? CRITICAL CARE ?Performed by: Milton Ferguson ?Total critical care time: 50 minutes ?Critical care time was exclusive of separately billable procedures and treating other patients. ?Critical care was necessary to treat or prevent imminent or life-threatening deterioration. ?Critical care was time spent personally by me on the following activities: development of treatment plan with patient and/or surrogate as well as nursing, discussions with consultants, evaluation of patient's response to treatment, examination of patient, obtaining history from patient or surrogate, ordering and performing treatments and interventions, ordering and review of laboratory studies, ordering and review of radiographic studies,  pulse oximetry and re-evaluation of patient's condition. ? ? ?Patient with rapid atrial flutter when he arrived.  He was given adenosine twice without help.  Then he was started on some Cardizem which did not seem to make a difference.  Patient was given Lopressor and that did slow his heart down some.  Cardiology was consulted ?                        ?Medical Decision Making ?Amount and/or Complexity of Data Reviewed ?Labs: ordered. ?Radiology: ordered. ? ?Risk ?Prescription drug management. ?Decision regarding hospitalization. ? ? ?This patient presents to the ED for concern of palpitations, this involves an extensive number of treatment options, and is a complaint that carries with it a high risk of complications and morbidity.  The differential diagnosis includes atrial fibs flutter ? ? ?Co morbidities that complicate the patient evaluation ? ?History of atrial for ? ? ?Additional history obtained: ? ?Additional history obtained from spouse ?External  records from outside source obtained and reviewed including hospital record ? ? ?Lab Tests: ? ?I Ordered, and personally interpreted labs.  The pertinent results include: CBC and chemistries unremarkable ? ? ?Imaging Studies

## 2021-07-11 ENCOUNTER — Ambulatory Visit (INDEPENDENT_AMBULATORY_CARE_PROVIDER_SITE_OTHER): Payer: Medicare HMO | Admitting: Family Medicine

## 2021-07-11 ENCOUNTER — Encounter: Payer: Self-pay | Admitting: Family Medicine

## 2021-07-11 VITALS — BP 124/82 | HR 62 | Ht 74.0 in | Wt 300.4 lb

## 2021-07-11 DIAGNOSIS — I4891 Unspecified atrial fibrillation: Secondary | ICD-10-CM | POA: Diagnosis not present

## 2021-07-11 DIAGNOSIS — Z09 Encounter for follow-up examination after completed treatment for conditions other than malignant neoplasm: Secondary | ICD-10-CM | POA: Insufficient documentation

## 2021-07-11 NOTE — Patient Instructions (Addendum)
I appreciate the opportunity to provide care to you today! ?  ?Please follow-up with cardiology and Afib clinic ? ?Congratulations on your weight loss of 12 lbs since your last visit. ? ?So proud of YOU! ? ?Keep up the good work with healthy eating habits and exercising ? ? ?It was a pleasure to see you and I look forward to continuing to work together on your health and well-being. ?Please do not hesitate to call the office if you need care or have questions about your care. ?  ?Have a wonderful day and week. ?With Gratitude, ?Alvira Monday MSN, FNP-BC  ?

## 2021-07-11 NOTE — Assessment & Plan Note (Signed)
Labs and imaging reviewed ?Patient is following up with cardiology and the A-fib clinic ?

## 2021-07-11 NOTE — Progress Notes (Signed)
? ?Established Patient Office Visit ? ?Subjective:  ?Patient ID: Harold West, male    DOB: 06/30/1950  Age: 71 y.o. MRN: [id number] ? ?CC:  ?Chief Complaint  ?Patient presents with  ? Follow-up  ?  Pt states he feels well today.  ? ? ?HPI ?Harold West is a 71 y.o. M with PMH of medical history significant of atrial fibrillation with prior ablation (x2 in [place] over 10 years ago; no longer requiring rate control agents and anticoagulation therapy), class II obesity, vitamin D deficiency, erectile dysfunction, and restless leg syndrome; who presents today for a hospital discharge follow-up. The patient was seen in the ED on 07/04/21 for A-fib with RVR  and a heart rate of 160s to 170s. After appropriate treatment with lopressor and digoxin, the patient was self-converted to sinus rhythm.  ? ?The patient is well after his encounter in the ED. Today, the patient denies dizziness, chest pain, palpitations, and SOB. The patient reports that he has lost 12 lbs and has been eating healthier and exercising. ? ?The patient is following up with cardiology and the A-fib clinic. ? ?  ? ?Past Medical History:  ?Diagnosis Date  ? Acute medial meniscus tear of right knee   ? Arthritis   ? Dysrhythmia   ? history of AFIB  ? Obesity (BMI 30-39.9) 02/24/2019  ? Old bucket handle tear of lateral meniscus of right knee   ? Testicular failure 02/24/2019  ? Vitamin D deficiency disease 02/24/2019  ? ? ?Past Surgical History:  ?Procedure Laterality Date  ? COLONOSCOPY N/A 03/10/2018  ? Procedure: COLONOSCOPY;  Surgeon: Daneil Dolin, MD;  Location: [facility];  Service: Endoscopy;  Laterality: N/A;  7:30  ? KNEE ARTHROSCOPY WITH MEDIAL MENISECTOMY Right 01/21/2020  ? Procedure: KNEE ARTHROSCOPY WITH MEDIAL AND LATERAL MENISECTOMY;  Surgeon: Carole Civil, MD;  Location: [facility];  Service: Orthopedics;  Laterality: Right;  ? KNEE SURGERY Right 1987  ? POLYPECTOMY  03/10/2018  ? Procedure: POLYPECTOMY;  Surgeon: Daneil Dolin,  MD;  Location: [facility];  Service: Endoscopy;;  (colon)  ? Testicle removed Left   ? VASECTOMY  1989  ? ? ?Family History  ?Problem Relation Age of Onset  ? Hypertension Mother   ? Diabetes Mother   ? Heart disease Father   ? ? ?Social History  ? ?Socioeconomic History  ? Marital status: Married  ?  Spouse name: Not on file  ? Number of children: Not on file  ? Years of education: Not on file  ? Highest education level: Not on file  ?Occupational History  ? Not on file  ?Tobacco Use  ? Smoking status: Never  ? Smokeless tobacco: Never  ?Vaping Use  ? Vaping Use: Never used  ?Substance and Sexual Activity  ? Alcohol use: Yes  ?  Comment: Occasional  ? Drug use: Never  ? Sexual activity: Yes  ?Other Topics Concern  ? Not on file  ?Social History Narrative  ? Married for 44 years.Lives with wife.Retired August 2020.  ? ?Social Determinants of Health  ? ?Financial Resource Strain: Not on file  ?Food Insecurity: Not on file  ?Transportation Needs: Not on file  ?Physical Activity: Not on file  ?Stress: Not on file  ?Social Connections: Not on file  ?Intimate Partner Violence: Not on file  ? ? ?Outpatient Medications Prior to Visit  ?Medication Sig Dispense Refill  ? Ascorbic Acid (VITAMIN C) 1000 MG tablet Take 1,000 mg by mouth daily.    ?  aspirin EC 81 MG tablet Take 81 mg by mouth daily.    ? Calcium Polycarbophil (FIBER-CAPS PO) Take 5 capsules by mouth daily.    ? Cholecalciferol (VITAMIN D3) 125 MCG (5000 UT) TABS Take 10,000 Units by mouth daily.     ? Glucosamine-Chondroit-Vit C-Mn (GLUCOSAMINE 1500 COMPLEX PO) Take 2 tablets by mouth daily.    ? metoprolol tartrate (LOPRESSOR) 25 MG tablet Take 1 tablet (25 mg total) by mouth 2 (two) times daily. 60 tablet 2  ? pramipexole (MIRAPEX) 0.125 MG tablet Take 1 tablet (0.125 mg total) by mouth at bedtime. Take 1 tablet 2 to 3 hours before bedtime. 30 tablet 6  ? sildenafil (VIAGRA) 50 MG tablet Take 1 tablet (50 mg total) by mouth daily as needed for erectile  dysfunction. (Patient not taking: Reported on 07/11/2021) 10 tablet 2  ? ?No facility-administered medications prior to visit.  ? ? ?Allergies  ?Allergen Reactions  ? Amiodarone Rash  ? Penicillins Rash  ?  Has patient had a PCN reaction causing immediate rash, facial/tongue/throat swelling, SOB or lightheadedness with hypotension: Unknown ?Has patient had a PCN reaction causing severe rash involving mucus membranes or skin necrosis: No ?Has patient had a PCN reaction that required hospitalization: No ?Has patient had a PCN reaction occurring within the last 10 years: No ?If all of the above answers are "NO", then may proceed with Cephalosporin use. ?  ? ? ?ROS ?Review of Systems  ?Constitutional:  Negative for chills, diaphoresis, fatigue and fever.  ?HENT:  Negative for rhinorrhea, sinus pain and sore throat.   ?Eyes:  Negative for photophobia, pain and redness.  ?Respiratory:  Negative for choking, chest tightness and shortness of breath.   ?Cardiovascular:  Negative for chest pain, palpitations and leg swelling.  ?Gastrointestinal:  Negative for constipation, diarrhea, nausea and vomiting.  ?Endocrine: Positive for polyuria. Negative for cold intolerance, polydipsia and polyphagia.  ?     For restles leg syndrome  ?Genitourinary:  Negative for hematuria, penile pain, penile swelling and urgency.  ?Musculoskeletal:  Negative for back pain, neck pain and neck stiffness.  ?Skin:  Negative for color change and rash.  ?Neurological:  Negative for dizziness, tremors, weakness, light-headedness and headaches.  ?Psychiatric/Behavioral:  Negative for confusion, self-injury and suicidal ideas.   ? ?  ?Objective:  ?  ?Physical Exam ?Constitutional:   ?   Appearance: Normal appearance. He is not ill-appearing or toxic-appearing.  ?HENT:  ?   Head: Normocephalic.  ?   Right Ear: External ear normal.  ?   Left Ear: External ear normal.  ?   Nose: No congestion or rhinorrhea.  ?   Mouth/Throat:  ?   Mouth: Mucous membranes are  moist.  ?   Pharynx: Oropharynx is clear.  ?Eyes:  ?   General:     ?   Right eye: No discharge.     ?   Left eye: No discharge.  ?   Extraocular Movements: Extraocular movements intact.  ?Cardiovascular:  ?   Rate and Rhythm: Normal rate and regular rhythm.  ?   Pulses: Normal pulses.  ?   Heart sounds: Normal heart sounds. No murmur heard. ?  No friction rub.  ?Pulmonary:  ?   Effort: Pulmonary effort is normal. No respiratory distress.  ?   Breath sounds: Normal breath sounds.  ?Abdominal:  ?   General: Bowel sounds are normal.  ?   Palpations: Abdomen is soft.  ?Musculoskeletal:  ?   Cervical back:  No tenderness.  ?   Right lower leg: No edema.  ?   Left lower leg: No edema.  ?Skin: ?   General: Skin is warm.  ?   Findings: No lesion or rash.  ?Neurological:  ?   Mental Status: He is alert and oriented to person, place, and time.  ?   Motor: No weakness.  ?Psychiatric:     ?   Behavior: Behavior normal.     ?   Thought Content: Thought content normal.  ? ? ?BP 124/82 (BP Location: Left Arm, Patient Position: Sitting)   Pulse 62   Ht _0  (1.88 m)   Wt (!) 300 lb 6.4 oz (136.3 kg)   SpO2 98%   BMI 38.57 kg/m?  ?Wt Readings from Last 3 Encounters:  ?07/11/21 (!) 300 lb 6.4 oz (136.3 kg)  ?07/04/21 (!) 311 lb 15.2 oz (141.5 kg)  ?06/20/21 (!) 312 lb (141.5 kg)  ? ? ?Lab Results  ?Component Value Date  ? TSH 1.156 07/04/2021  ? ?Lab Results  ?Component Value Date  ? WBC 7.0 07/05/2021  ? HGB 12.6 (L) 07/05/2021  ? HCT 40.7 07/05/2021  ? MCV 88.1 07/05/2021  ? PLT 168 07/05/2021  ? ?Lab Results  ?Component Value Date  ? NA 139 07/05/2021  ? K 4.2 07/05/2021  ? CO2 23 07/05/2021  ? GLUCOSE 92 07/05/2021  ? BUN 18 07/05/2021  ? CREATININE 0.80 07/05/2021  ? BILITOT 1.2 07/04/2021  ? ALKPHOS 50 07/04/2021  ? AST 26 07/04/2021  ? ALT 27 07/04/2021  ? PROT 7.1 07/04/2021  ? ALBUMIN 4.2 07/04/2021  ? CALCIUM 8.8 (L) 07/05/2021  ? ANIONGAP 6 07/05/2021  ? EGFR 87 06/14/2021  ? ?Lab Results  ?Component Value Date  ?  CHOL 102 06/14/2021  ? ?Lab Results  ?Component Value Date  ? HDL 40 06/14/2021  ? ?Lab Results  ?Component Value Date  ? LDLCALC 51 06/14/2021  ? ?Lab Results  ?Component Value Date  ? TRIG 42 06/14/2021  ? ?Lab R

## 2021-07-12 MED FILL — Perflutren Lipid Microsphere IV Susp 1.1 MG/ML: INTRAVENOUS | Qty: 10 | Status: AC

## 2021-07-19 ENCOUNTER — Encounter: Payer: Self-pay | Admitting: Physician Assistant

## 2021-07-19 NOTE — Progress Notes (Signed)
? ?  Follow-Up Visit ?  ?Subjective  ?Harold West is a 71 y.o. male who presents for the following: Follow-up (6 month f/u- left upper arm anterior- bcc- txpbx- No concerns, healing ok). ? ? ?The following portions of the chart were reviewed this encounter and updated as appropriate:  Tobacco  Allergies  Meds  Problems  Med Hx  Surg Hx  Fam Hx   ?  ? ?Objective  ?Well appearing patient in no apparent distress; mood and affect are within normal limits. ? ?All skin waist up examined. ? ?Left Upper Arm - Anterior ?White scar- clear ? ? ?Assessment & Plan  ?History of basal cell cancer ?Left Upper Arm - Anterior ? ?Yearly skin examination  ? ? ? ?I, Dawson Albers, PA-C, have reviewed all documentation's for this visit.  The documentation on 07/19/21 for the exam, diagnosis, procedures and orders are all accurate and complete. ?

## 2021-07-20 ENCOUNTER — Ambulatory Visit (INDEPENDENT_AMBULATORY_CARE_PROVIDER_SITE_OTHER): Payer: Medicare HMO | Admitting: Podiatry

## 2021-07-20 DIAGNOSIS — B351 Tinea unguium: Secondary | ICD-10-CM

## 2021-07-20 DIAGNOSIS — M79674 Pain in right toe(s): Secondary | ICD-10-CM

## 2021-07-20 DIAGNOSIS — M79675 Pain in left toe(s): Secondary | ICD-10-CM | POA: Diagnosis not present

## 2021-07-24 ENCOUNTER — Encounter: Payer: Self-pay | Admitting: Podiatry

## 2021-07-24 NOTE — Progress Notes (Signed)
?  Subjective:  ?Patient ID: Harold West, male    DOB: 09/19/1950,  MRN: [id number] ? ?Chief Complaint  ?Patient presents with  ? Nail Problem  ? Callouses  ?  Nail and callus trim  ? ?71 y.o. male returns for the above complaint.  Patient presents with thickened elongated dystrophic toenails x10 mild pain on palpation.  He is not able to debride himself.  He would like for me to do it.  He denies any other acute complaints ? ?Objective:  ?There were no vitals filed for this visit. ?Podiatric Exam: ?Vascular: dorsalis pedis and posterior tibial pulses are palpable bilateral. Capillary return is immediate. Temperature gradient is WNL. Skin turgor WNL  ?Sensorium: Normal Semmes Weinstein monofilament test. Normal tactile sensation bilaterally. ?Nail Exam: Pt has thick disfigured discolored nails with subungual debris noted bilateral entire nail hallux through fifth toenails.  Pain on palpation to the nails. ?Ulcer Exam: There is no evidence of ulcer or pre-ulcerative changes or infection. ?Orthopedic Exam: Muscle tone and strength are WNL. No limitations in general ROM. No crepitus or effusions noted.  ?Skin: No Porokeratosis. No infection or ulcers ? ? ? ?Assessment & Plan:  ? ?1. Pain due to onychomycosis of toenails of both feet   ? ? ?Patient was evaluated and treated and all questions answered. ? ?Onychomycosis with pain  ?-Nails palliatively debrided as below. ?-Educated on self-care ? ?Procedure: Nail Debridement ?Rationale: pain  ?Type of Debridement: manual, sharp debridement. ?Instrumentation: Nail nipper, rotary burr. ?Number of Nails: 10 ? ?Procedures and Treatment: Consent by patient was obtained for treatment procedures. The patient understood the discussion of treatment and procedures well. All questions were answered thoroughly reviewed. Debridement of mycotic and hypertrophic toenails, 1 through 5 bilateral and clearing of subungual debris. No ulceration, no infection noted.  ?Return Visit-Office  Procedure: Patient instructed to return to the office for a follow up visit 3 months for continued evaluation and treatment. ? ?Boneta Lucks, DPM ?  ? ?No follow-ups on file. ? ?

## 2021-08-06 NOTE — Progress Notes (Signed)
Cardiology Office Note    Date:  08/13/2021   ID:  Harold West, DOB 19-Sep-1950, MRN [id number]   PCP:  Anabel Halon, MD   [facility]  Cardiologist:  None   Advanced Practice Provider:  No care team member to display Electrophysiologist:  None   [id number]}   Chief Complaint  Patient presents with   Hospitalization Follow-up    History of Present Illness:  Harold West is a 71 y.o. male with history of atrial flutter and fibrillation ablation 10 years ago in [place].  Patient was admitted to the hospital 06/2021 with atrial flutter with RVR and self converted to normal sinus rhythm.  CHA2DS2-VASc equals 1 so is not anticoagulated.  Patient comes in with his wife for f/u. Denies any trouble since in the hospital. Denies chest pain, dyspnea, palpitations, edema.     Past Medical History:  Diagnosis Date   Acute medial meniscus tear of right knee    Arthritis    Dysrhythmia    history of AFIB   Obesity (BMI 30-39.9) 02/24/2019   Old bucket handle tear of lateral meniscus of right knee    Testicular failure 02/24/2019   Vitamin D deficiency disease 02/24/2019    Past Surgical History:  Procedure Laterality Date   COLONOSCOPY N/A 03/10/2018   Procedure: COLONOSCOPY;  Surgeon: Corbin Ade, MD;  Location: [facility];  Service: Endoscopy;  Laterality: N/A;  7:30   KNEE ARTHROSCOPY WITH MEDIAL MENISECTOMY Right 01/21/2020   Procedure: KNEE ARTHROSCOPY WITH MEDIAL AND LATERAL MENISECTOMY;  Surgeon: Vickki Hearing, MD;  Location: [facility];  Service: Orthopedics;  Laterality: Right;   KNEE SURGERY Right 1987   POLYPECTOMY  03/10/2018   Procedure: POLYPECTOMY;  Surgeon: Corbin Ade, MD;  Location: [facility];  Service: Endoscopy;;  (colon)   Testicle removed Left    VASECTOMY  1989    Current Medications: Current Meds  Medication Sig   albuterol (VENTOLIN HFA) 108 (90 Base) MCG/ACT inhaler Inhale 2 puffs into the lungs every 6  (six) hours as needed for wheezing or shortness of breath.   Ascorbic Acid (VITAMIN C) 1000 MG tablet Take 1,000 mg by mouth daily.   aspirin EC 81 MG tablet Take 81 mg by mouth daily.   Calcium Polycarbophil (FIBER-CAPS PO) Take 5 capsules by mouth daily.   Cholecalciferol (VITAMIN D3) 125 MCG (5000 UT) TABS Take 10,000 Units by mouth daily.    Glucosamine-Chondroit-Vit C-Mn (GLUCOSAMINE 1500 COMPLEX PO) Take 2 tablets by mouth daily.   metoprolol tartrate (LOPRESSOR) 25 MG tablet Take 1 tablet (25 mg total) by mouth 2 (two) times daily.   pramipexole (MIRAPEX) 0.125 MG tablet Take 1 tablet (0.125 mg total) by mouth at bedtime. Take 1 tablet 2 to 3 hours before bedtime.   promethazine-dextromethorphan (PROMETHAZINE-DM) 6.25-15 MG/5ML syrup Take 5 mLs by mouth 4 (four) times daily as needed for cough.     Allergies:   Amiodarone and Penicillins   Social History   Socioeconomic History   Marital status: Married    Spouse name: Not on file   Number of children: Not on file   Years of education: Not on file   Highest education level: Not on file  Occupational History   Not on file  Tobacco Use   Smoking status: Never   Smokeless tobacco: Never  Vaping Use   Vaping Use: Never used  Substance and Sexual Activity   Alcohol use: Yes  Comment: Occasional   Drug use: Never   Sexual activity: Yes  Other Topics Concern   Not on file  Social History Narrative   Married for 44 years.Lives with wife.Retired August 2020.   Social Determinants of Health   Financial Resource Strain: Not on file  Food Insecurity: Not on file  Transportation Needs: Not on file  Physical Activity: Not on file  Stress: Not on file  Social Connections: Not on file     Family History:  The patient's  family history includes Diabetes in his mother; Heart disease in his father; Hypertension in his mother.   ROS:   Please see the history of present illness.    ROS All other systems reviewed and are  negative.   PHYSICAL EXAM:   VS:  BP 124/68   Pulse 65   Ht 6\' 2"  (1.88 m)   Wt (!) 306 lb (138.8 kg)   SpO2 96%   BMI 39.29 kg/m   Physical Exam  GEN: Obese, in no acute distress  Neck: no JVD, carotid bruits, or masses Cardiac:RRR; no murmurs, rubs, or gallops  Respiratory:  clear to auscultation bilaterally, normal work of breathing GI: soft, nontender, nondistended, + BS Ext: without cyanosis, clubbing, or edema, Good distal pulses bilaterally Neuro:  Alert and Oriented x 3, Strength and sensation are intact Psych: euthymic mood, full affect  Wt Readings from Last 3 Encounters:  08/13/21 (!) 306 lb (138.8 kg)  07/11/21 (!) 300 lb 6.4 oz (136.3 kg)  07/04/21 (!) 311 lb 15.2 oz (141.5 kg)      Studies/Labs Reviewed:   EKG:  EKG is not ordered today.    Recent Labs: 07/04/2021: ALT 27; Magnesium 2.4; TSH 1.156 07/05/2021: BUN 18; Creatinine, Ser 0.80; Hemoglobin 12.6; Platelets 168; Potassium 4.2; Sodium 139   Lipid Panel    Component Value Date/Time   CHOL 102 06/14/2021 1650   TRIG 42 06/14/2021 1650   HDL 40 06/14/2021 1650   CHOLHDL 2.6 06/14/2021 1650   CHOLHDL 4.1 11/30/2019 1653   LDLCALC 51 06/14/2021 1650   LDLCALC 72 11/30/2019 1653    Additional studies/ records that were reviewed today include:    Echo 07/05/21 IMPRESSIONS     1. Left ventricular ejection fraction, by estimation, is 55 to 60%. The  left ventricle has normal function. The left ventricle has no regional  wall motion abnormalities. There is mild left ventricular hypertrophy.  Left ventricular diastolic parameters  were normal.   2. Right ventricular systolic function is normal. The right ventricular  size is normal. Tricuspid regurgitation signal is inadequate for assessing  PA pressure.   3. The mitral valve is normal in structure. No evidence of mitral valve  regurgitation. No evidence of mitral stenosis.   4. The aortic valve is tricuspid. Aortic valve regurgitation is not   visualized. No aortic stenosis is present.   5. The inferior vena cava is normal in size with greater than 50%  respiratory variability, suggesting right atrial pressure of 3 mmHg.   Risk Assessment/Calculations:    CHA2DS2-VASc Score = 1   This indicates a 0.6% annual risk of stroke. The patient's score is based upon: CHF History: 0 HTN History: 0 Diabetes History: 0 Stroke History: 0 Vascular Disease History: 0 Age Score: 1 Gender Score: 0         ASSESSMENT:    1. Atrial flutter, unspecified type (HCC)   2. Essential hypertension   3. Obesity (BMI 30-39.9)  PLAN:  In order of problems listed above:  Atrial flutter status post ablation 10 years ago [place] with recurrent atrial flutter with RVR 06/2021 self converted to normal sinus rhythm.  CHA2DS2-VASc equals 1 so no anticoagulation  HTN-BP was running high so started on metoprolol 25 mg bid. Now more controlled. 2 gm sodium diet  Obesity-exercise and weight loss discussed.  Shared Decision Making/Informed Consent        Medication Adjustments/Labs and Tests Ordered: Current medicines are reviewed at length with the patient today.  Concerns regarding medicines are outlined above.  Medication changes, Labs and Tests ordered today are listed in the Patient Instructions below. Patient Instructions  Medication Instructions:  No Change  Labwork: None   Testing/Procedures: None  Follow-Up: Keep scheduled follow up with Dr. Ladona Ridgel  Any Other Special Instructions Will Be Listed Below (If Applicable).   Continue to exercise for 150 minutes weekly.   If you need a refill on your cardiac medications before your next appointment, please call your pharmacy.   Two Gram Sodium Diet 2000 mg  What is Sodium? Sodium is a mineral found naturally in many foods. The most significant source of sodium in the diet is table salt, which is about 40% sodium.  Processed, convenience, and preserved foods also contain a  large amount of sodium.  The body needs only 500 mg of sodium daily to function,  A normal diet provides more than enough sodium even if you do not use salt.  Why Limit Sodium? A build up of sodium in the body can cause thirst, increased blood pressure, shortness of breath, and water retention.  Decreasing sodium in the diet can reduce edema and risk of heart attack or stroke associated with high blood pressure.  Keep in mind that there are many other factors involved in these health problems.  Heredity, obesity, lack of exercise, cigarette smoking, stress and what you eat all play a role.  General Guidelines: Do not add salt at the table or in cooking.  One teaspoon of salt contains over 2 grams of sodium. Read food labels Avoid processed and convenience foods Ask your dietitian before eating any foods not dicussed in the menu planning guidelines Consult your physician if you wish to use a salt substitute or a sodium containing medication such as antacids.  Limit milk and milk products to 16 oz (2 cups) per day.  Shopping Hints: READ LABELS!! "Dietetic" does not necessarily mean low sodium. Salt and other sodium ingredients are often added to foods during processing.    Menu Planning Guidelines Food Group Choose More Often Avoid  Beverages (see also the milk group All fruit juices, low-sodium, salt-free vegetables juices, low-sodium carbonated beverages Regular vegetable or tomato juices, commercially softened water used for drinking or cooking  Breads and Cereals Enriched white, wheat, rye and pumpernickel bread, hard rolls and dinner rolls; muffins, cornbread and waffles; most dry cereals, cooked cereal without added salt; unsalted crackers and breadsticks; low sodium or homemade bread crumbs Bread, rolls and crackers with salted tops; quick breads; instant hot cereals; pancakes; commercial bread stuffing; self-rising flower and biscuit mixes; regular bread crumbs or cracker crumbs  Desserts  and Sweets Desserts and sweets mad with mild should be within allowance Instant pudding mixes and cake mixes  Fats Butter or margarine; vegetable oils; unsalted salad dressings, regular salad dressings limited to 1 Tbs; light, sour and heavy cream Regular salad dressings containing bacon fat, bacon bits, and salt pork; snack dips made with instant  soup mixes or processed cheese; salted nuts  Fruits Most fresh, frozen and canned fruits Fruits processed with salt or sodium-containing ingredient (some dried fruits are processed with sodium sulfites        Vegetables Fresh, frozen vegetables and low- sodium canned vegetables Regular canned vegetables, sauerkraut, pickled vegetables, and others prepared in brine; frozen vegetables in sauces; vegetables seasoned with ham, bacon or salt pork  Condiments, Sauces, Miscellaneous  Salt substitute with physician's approval; pepper, herbs, spices; vinegar, lemon or lime juice; hot pepper sauce; garlic powder, onion powder, low sodium soy sauce (1 Tbs.); low sodium condiments (ketchup, chili sauce, mustard) in limited amounts (1 tsp.) fresh ground horseradish; unsalted tortilla chips, pretzels, potato chips, popcorn, salsa (1/4 cup) Any seasoning made with salt including garlic salt, celery salt, onion salt, and seasoned salt; sea salt, rock salt, kosher salt; meat tenderizers; monosodium glutamate; mustard, regular soy sauce, barbecue, sauce, chili sauce, teriyaki sauce, steak sauce, Worcestershire sauce, and most flavored vinegars; canned gravy and mixes; regular condiments; salted snack foods, olives, picles, relish, horseradish sauce, catsup   Food preparation: Try these seasonings Meats:    Pork Sage, onion Serve with applesauce  Chicken Poultry seasoning, thyme, parsley Serve with cranberry sauce  Lamb Curry powder, rosemary, garlic, thyme Serve with mint sauce or jelly  Veal Marjoram, basil Serve with current jelly, cranberry sauce  Beef Pepper, bay leaf  Serve with dry mustard, unsalted chive butter  Fish [place] leaf, dill Serve with unsalted lemon butter, unsalted parsley butter  Vegetables:    Asparagus Lemon juice   Broccoli Lemon juice   Carrots Mustard dressing parsley, mint, nutmeg, glazed with unsalted butter and sugar   Green beans Marjoram, lemon juice, nutmeg,dill seed   Tomatoes Basil, marjoram, onion   Spice /blend for [employer]" 4 tsp ground thyme 1 tsp ground sage 3 tsp ground rosemary 4 tsp ground marjoram   Test your knowledge A product that says "Salt Free" may still contain sodium. True or False Garlic Powder and Hot Pepper Sauce an be used as alternative seasonings.True or False Processed foods have more sodium than fresh foods.  True or False Canned Vegetables have less sodium than froze True or False   WAYS TO DECREASE YOUR SODIUM INTAKE Avoid the use of added salt in cooking and at the table.  Table salt (and other prepared seasonings which contain salt) is probably one of the greatest sources of sodium in the diet.  Unsalted foods can gain flavor from the sweet, sour, and butter taste sensations of herbs and spices.  Instead of using salt for seasoning, try the following seasonings with the foods listed.  Remember: how you use them to enhance natural food flavors is limited only by your creativity... Allspice-Meat, fish, eggs, fruit, peas, red and yellow vegetables Almond Extract-Fruit baked goods Anise Seed-Sweet breads, fruit, carrots, beets, cottage cheese, cookies (tastes like licorice) Basil-Meat, fish, eggs, vegetables, rice, vegetables salads, soups, sauces Bay Leaf-Meat, fish, stews, poultry Burnet-Salad, vegetables (cucumber-like flavor) Caraway Seed-Bread, cookies, cottage cheese, meat, vegetables, cheese, rice Cardamon-Baked goods, fruit, soups Celery Powder or seed-Salads, salad dressings, sauces, meatloaf, soup, bread.Do not use  celery salt Chervil-Meats, salads, fish, eggs, vegetables, cottage  cheese (parsley-like flavor) Chili Power-Meatloaf, chicken cheese, corn, eggplant, egg dishes Chives-Salads cottage cheese, egg dishes, soups, vegetables, sauces Cilantro-Salsa, casseroles Cinnamon-Baked goods, fruit, pork, lamb, chicken, carrots Cloves-Fruit, baked goods, fish, pot roast, green beans, beets, carrots Coriander-Pastry, cookies, meat, salads, cheese (lemon-orange flavor) Cumin-Meatloaf, fish,cheese, eggs, cabbage,fruit pie (caraway flavor)  [employer], fruit, eggs, fish, poultry, cottage cheese, vegetables Dill Seed-Meat, cottage cheese, poultry, vegetables, fish, salads, bread Fennel Seed-Bread, cookies, apples, pork, eggs, fish, beets, cabbage, cheese, Licorice-like flavor Garlic-(buds or powder) Salads, meat, poultry, fish, bread, butter, vegetables, potatoes.Do not  use garlic salt Ginger-Fruit, vegetables, baked goods, meat, fish, poultry Horseradish Root-Meet, vegetables, butter Lemon Juice or Extract-Vegetables, fruit, tea, baked goods, fish salads Mace-Baked goods fruit, vegetables, fish, poultry (taste like nutmeg) Maple Extract-Syrups Marjoram-Meat, chicken, fish, vegetables, breads, green salads (taste like Sage) Mint-Tea, lamb, sherbet, vegetables, desserts, carrots, cabbage Mustard, Dry or Seed-Cheese, eggs, meats, vegetables, poultry Nutmeg-Baked goods, fruit, chicken, eggs, vegetables, desserts Onion Powder-Meat, fish, poultry, vegetables, cheese, eggs, bread, rice salads (Do not use   Onion salt) Orange Extract-Desserts, baked goods Oregano-Pasta, eggs, cheese, onions, pork, lamb, fish, chicken, vegetables, green salads Paprika-Meat, fish, poultry, eggs, cheese, vegetables Parsley Flakes-Butter, vegetables, meat fish, poultry, eggs, bread, salads (certain forms may   Contain sodium Pepper-Meat fish, poultry, vegetables, eggs Peppermint Extract-Desserts, baked goods Poppy Seed-Eggs, bread, cheese, fruit dressings, baked goods, noodles, vegetables,  cottage  [employer], poultry, meat, fish, cauliflower, turnips,eggs bread Saffron-Rice, bread, veal, chicken, fish, eggs Sage-Meat, fish, poultry, onions, eggplant, tomateos, pork, stews Savory-Eggs, salads, poultry, meat, rice, vegetables, soups, pork Tarragon-Meat, poultry, fish, eggs, butter, vegetables (licorice-like flavor)  Thyme-Meat, poultry, fish, eggs, vegetables, (clover-like flavor), sauces, soups Tumeric-Salads, butter, eggs, fish, rice, vegetables (saffron-like flavor) Vanilla Extract-Baked goods, candy Vinegar-Salads, vegetables, meat marinades Walnut Extract-baked goods, candy   2. Choose your Foods Wisely   The following is a list of foods to avoid which are high in sodium:  Meats-Avoid all smoked, canned, salt cured, dried and kosher meat and fish as well as Anchovies   [employer] meats:Bologna, Liverwurst, Pastrami Canned meat or fish  Marinated herring Caviar    Pepperoni Corned Beef   Pizza Dried chipped beef  Salami Frozen breaded fish or meat Salt pork Frankfurters or hot dogs  Sardines Gefilte fish   Sausage Ham (boiled ham, Proscuitto Smoked butt    spiced ham)   Spam      TV Dinners Vegetables Canned vegetables (Regular) Relish Canned mushrooms  Sauerkraut Olives    Tomato juice Pickles  Bakery and Dessert Products Canned puddings  Cream pies Cheesecake   Decorated cakes Cookies  Beverages/Juices Tomato juice, regular  Gatorade   V-8 vegetable juice, regular  Breads and Cereals Biscuit mixes   Salted potato chips, corn chips, pretzels Bread stuffing mixes  Salted crackers and rolls Pancake and waffle mixes Self-rising flour  Seasonings Accent    Meat sauces Barbecue sauce  Meat tenderizer Catsup    Monosodium glutamate (MSG) Celery salt   Onion salt Chili sauce   Prepared mustard Garlic salt   Salt, seasoned salt, sea salt Gravy mixes   Soy sauce Horseradish   Steak  sauce Ketchup   Tartar sauce Lite salt    Teriyaki sauce Marinade mixes   Worcestershire sauce  Others Baking powder   Cocoa and cocoa mixes Baking soda   Commercial casserole mixes Candy-caramels, chocolate  Dehydrated soups    Bars, fudge,nougats  Instant rice and pasta mixes Canned broth or soup  Maraschino cherries Cheese, aged and processed cheese and cheese spreads  Learning Assessment Quiz  Indicated T (for True) or F (for False) for each of the following statements:  _____ Fresh fruits and vegetables and unprocessed grains are generally low in sodium _____ Water may contain a considerable amount  of sodium, depending on the source _____ You can always tell if a food is high in sodium by tasting it _____ Certain laxatives my be high in sodium and should be avoided unless prescribed   by a physician or pharmacist _____ Salt substitutes may be used freely by anyone on a sodium restricted diet _____ Sodium is present in table salt, food additives and as a natural component of   most foods _____ Table salt is approximately 90% sodium _____ Limiting sodium intake may help prevent excess fluid accumulation in the body _____ On a sodium-restricted diet, seasonings such as bouillon soy sauce, and    cooking wine should be used in place of table salt _____ On an ingredient list, a product which lists monosodium glutamate as the first   ingredient is an appropriate food to include on a low sodium diet  Circle the best answer(s) to the following statements (Hint: there may be more than one correct answer)  11. On a low-sodium diet, some acceptable snack items are:    A. Olives  F. Bean dip   K. Grapefruit juice    B. Salted Pretzels G. Commercial Popcorn   L. Canned peaches    C. Carrot Sticks  H. Bouillon   M. Unsalted nuts   D. [place] fries  I. Peanut butter crackers N. Salami   E. Sweet pickles J. Tomato Juice   O. Pizza  12.  Seasonings that may be used freely on a reduced -  sodium diet include   A. Lemon wedges F.Monosodium glutamate K. Celery seed    B.Soysauce   G. Pepper   L. Mustard powder   C. Sea salt  H. Cooking wine  M. Onion flakes   D. Vinegar  E. Prepared horseradish N. Salsa   E. Sage   J. Worcestershire sauce  O. Chutney    Signed, Jacolyn Reedy, PA-C  08/13/2021 2:27 PM    [facility] [address] [place], [place], [place]  [address] Phone: [phone]; Fax: [phone]

## 2021-08-09 ENCOUNTER — Ambulatory Visit
Admission: EM | Admit: 2021-08-09 | Discharge: 2021-08-09 | Disposition: A | Discharge status: Home / Self Care | Payer: Medicare HMO | Attending: Nurse Practitioner | Admitting: Nurse Practitioner

## 2021-08-09 DIAGNOSIS — R051 Acute cough: Secondary | ICD-10-CM

## 2021-08-09 MED ORDER — ALBUTEROL SULFATE HFA 108 (90 BASE) MCG/ACT IN AERS: 2.0000 | INHALATION_SPRAY | Freq: Four times a day (QID) | RESPIRATORY_TRACT | 0 refills | Status: AC | PRN

## 2021-08-09 MED ORDER — PROMETHAZINE-DM 6.25-15 MG/5ML PO SYRP: 5.0000 mL | ORAL_SOLUTION | Freq: Four times a day (QID) | ORAL | 0 refills | Status: DC | PRN

## 2021-08-09 NOTE — Discharge Instructions (Signed)
Take medication as prescribed. ?As discussed, antibiotics are not necessary at this time as you do not have fever, productive cough, or symptoms that indicate infection. ?Increase fluids and get plenty of rest. ?May take over-the-counter allergy relief to help with any nasal congestion or runny nose.  This may also help with postnasal drainage. ?Follow-up if symptoms worsen or do not improve.  Please be advised that your cough may linger for the next 3 to 4 weeks.  If you continue to have the cough only, but you do not feel bad, continue with symptomatic treatment. ?

## 2021-08-09 NOTE — ED Provider Notes (Signed)
?[facility] ? ? ? ?CSN: [id number] ?Arrival date & time: 08/09/21  1606 ? ? ?  ? ?History   ?Chief Complaint ?Chief Complaint  ?Patient presents with  ? Cough  ? ? ?HPI ?Harold West is a 71 y.o. male.  ? ?The patient is a 71 year old male who presents with cough.  Symptoms have been present for the past week.  He states symptoms started with a sore throat and nasal drainage.  He states those symptoms have since resolved.  He states the cough is productive of saliva, but he only swallows it back down.  He denies fever, chills, headache, shortness of breath, or abdominal symptoms.  He states that he does have some intermittent wheezing.  Patient reports that he had COVID approximately 6 to 8 weeks ago.  Patient states that he does not feel bad.  He has been taking over-the-counter Mucinex this helps her for his symptoms. ? ?The history is provided by the patient.  ? ?Past Medical History:  ?Diagnosis Date  ? Acute medial meniscus tear of right knee   ? Arthritis   ? Dysrhythmia   ? history of AFIB  ? Obesity (BMI 30-39.9) 02/24/2019  ? Old bucket handle tear of lateral meniscus of right knee   ? Testicular failure 02/24/2019  ? Vitamin D deficiency disease 02/24/2019  ? ? ?Patient Active Problem List  ? Diagnosis Date Noted  ? Hospital discharge follow-up 07/11/2021  ? Atrial fibrillation with rapid ventricular response ([place]) 07/04/2021  ? RLS (restless legs syndrome) 07/04/2021  ? Erectile dysfunction 06/14/2021  ? History of cardiac radiofrequency ablation 06/14/2021  ? Encounter for general adult medical examination with abnormal findings 06/14/2021  ? Myoclonic jerking 06/14/2021  ? Prostate cancer screening 06/14/2021  ? S/P right knee arthroscopy 01/21/20 01/24/2020  ? Atrial fibrillation ([place]) 12/07/2019  ? Vitamin D deficiency disease 02/24/2019  ? Obesity (BMI 30-39.9) 02/24/2019  ? ? ?Past Surgical History:  ?Procedure Laterality Date  ? COLONOSCOPY N/A 03/10/2018  ? Procedure: COLONOSCOPY;   Surgeon: Daneil Dolin, MD;  [facility];  Service: Endoscopy;  Laterality: N/A;  7:30  ? KNEE ARTHROSCOPY WITH MEDIAL MENISECTOMY Right 01/21/2020  ? Procedure: KNEE ARTHROSCOPY WITH MEDIAL AND LATERAL MENISECTOMY;  Surgeon: Carole Civil, MD;  Location: [facility];  Service: Orthopedics;  Laterality: Right;  ? KNEE SURGERY Right 1987  ? POLYPECTOMY  03/10/2018  ? Procedure: POLYPECTOMY;  Surgeon: Daneil Dolin, MD;  Location: [facility];  Service: Endoscopy;;  (colon)  ? Testicle removed Left   ? VASECTOMY  1989  ? ? ? ? ? ?Home Medications   ? ?Prior to Admission medications   ?Medication Sig Start Date End Date Taking? Authorizing Provider  ?albuterol (VENTOLIN HFA) 108 (90 Base) MCG/ACT inhaler Inhale 2 puffs into the lungs every 6 (six) hours as needed for wheezing or shortness of breath. 08/09/21  Yes Alila Sotero-Warren, Alda Lea, NP  ?promethazine-dextromethorphan (PROMETHAZINE-DM) 6.25-15 MG/5ML syrup Take 5 mLs by mouth 4 (four) times daily as needed for cough. 08/09/21  Yes Daxx Tiggs-Warren, Alda Lea, NP  ?Ascorbic Acid (VITAMIN C) 1000 MG tablet Take 1,000 mg by mouth daily.    [provider]  ?aspirin EC 81 MG tablet Take 81 mg by mouth daily.    [provider]  ?Calcium Polycarbophil (FIBER-CAPS PO) Take 5 capsules by mouth daily.    [provider]  ?Cholecalciferol (VITAMIN D3) 125 MCG (5000 UT) TABS Take 10,000 Units by mouth daily.  [provider]  ?Glucosamine-Chondroit-Vit C-Mn (GLUCOSAMINE 1500 COMPLEX PO) Take 2 tablets by mouth daily.    [provider]  ?metoprolol tartrate (LOPRESSOR) 25 MG tablet Take 1 tablet (25 mg total) by mouth 2 (two) times daily. 07/05/21   Barton Dubois, MD  ?pramipexole (MIRAPEX) 0.125 MG tablet Take 1 tablet (0.125 mg total) by mouth at bedtime. Take 1 tablet 2 to 3 hours before bedtime. 06/22/21 07/22/21  Alric Ran, MD  ?sildenafil (VIAGRA) 50 MG tablet Take 1 tablet (50 mg total) by mouth daily as  needed for erectile dysfunction. ?Patient not taking: Reported on 07/11/2021 06/14/21   Lindell Spar, MD  ? ? ?Family History ?Family History  ?Problem Relation Age of Onset  ? Hypertension Mother   ? Diabetes Mother   ? Heart disease Father   ? ? ?Social History ?Social History  ? ?Tobacco Use  ? Smoking status: Never  ? Smokeless tobacco: Never  ?Vaping Use  ? Vaping Use: Never used  ?Substance Use Topics  ? Alcohol use: Yes  ?  Comment: Occasional  ? Drug use: Never  ? ? ? ?Allergies   ?Amiodarone and Penicillins ? ? ?Review of Systems ?Review of Systems  ?Constitutional: Negative.   ?HENT: Negative.    ?Respiratory:  Positive for cough and wheezing. Negative for shortness of breath.   ?Cardiovascular: Negative.   ?Gastrointestinal: Negative.   ?Skin: Negative.   ?Psychiatric/Behavioral: Negative.    ? ? ?Physical Exam ?Triage Vital Signs ?ED Triage Vitals [08/09/21 1652]  ?Enc Vitals Group  ?   BP (!) 143/84  ?   Pulse Rate 67  ?   Resp 18  ?   Temp 97.7 ?F (36.5 ?C)  ?   Temp Source Oral  ?   SpO2 94 %  ?   Weight   ?   Height   ?   Head Circumference   ?   Peak Flow   ?   Pain Score 0  ?   Pain Loc   ?   Pain Edu?   ?   Excl. in [place]?   ? ?No data found. ? ?Updated Vital Signs ?BP (!) 143/84 (BP Location: Right Arm)   Pulse 67   Temp 97.7 ?F (36.5 ?C) (Oral)   Resp 18   SpO2 94%  ? ?Visual Acuity ?Right Eye Distance:   ?Left Eye Distance:   ?Bilateral Distance:   ? ?Right Eye Near:   ?Left Eye Near:    ?Bilateral Near:    ? ?Physical Exam ?Vitals and nursing note reviewed.  ?Constitutional:   ?   General: He is not in acute distress. ?   Appearance: Normal appearance.  ?HENT:  ?   Head: Normocephalic and atraumatic.  ?   Right Ear: Tympanic membrane, ear canal and external ear normal.  ?   Left Ear: Tympanic membrane, ear canal and external ear normal.  ?   Nose: Nose normal.  ?   Mouth/Throat:  ?   Mouth: Mucous membranes are moist.  ?Eyes:  ?   Extraocular Movements: Extraocular movements intact.  ?    Conjunctiva/sclera: Conjunctivae normal.  ?   Pupils: Pupils are equal, round, and reactive to light.  ?Cardiovascular:  ?   Rate and Rhythm: Normal rate and regular rhythm.  ?   Pulses: Normal pulses.  ?   Heart sounds: Normal heart sounds.  ?Pulmonary:  ?   Breath sounds: Wheezing (posterior LLL) present.  ?Abdominal:  ?  General: Bowel sounds are normal.  ?   Palpations: Abdomen is soft.  ?Musculoskeletal:  ?   Cervical back: Normal range of motion.  ?Skin: ?   Capillary Refill: Capillary refill takes less than 2 seconds.  ?Neurological:  ?   General: No focal deficit present.  ?   Mental Status: He is alert and oriented to person, place, and time.  ?Psychiatric:     ?   Mood and Affect: Mood normal.     ?   Behavior: Behavior normal.  ? ? ? ?UC Treatments / Results  ?Labs ?(all labs ordered are listed, but only abnormal results are displayed) ?Labs Reviewed - No data to display ? ?EKG ? ? ?Radiology ?No results found. ? ?Procedures ?Procedures (including critical care time) ? ?Medications Ordered in UC ?Medications - No data to display ? ?Initial Impression / Assessment and Plan / UC Course  ?I have reviewed the triage vital signs and the nursing notes. ? ?Pertinent labs & imaging results that were available during my care of the patient were reviewed by me and considered in my medical decision making (see chart for details). ? ?The patient is a 71 year old male who presents with cough.  Symptoms have been progressive assistant for the past week.  Patient's symptoms started with sore throat and runny nose.  The symptoms have since improved.  On exam, the patient is in no acute distress, his vital signs are stable.  Wheezing was auscultated in the left lower lobe.  Patient denies any shortness of breath, he is in no respiratory distress.  Imaging is not warranted at this time.  Patient advised that symptoms do not warrant an antibiotic as there seems to be no signs of bacterial infection present.  Supportive  care to include increasing fluids and getting plenty of rest.  Also will provide the patient with symptomatic treatment.  Patient advised that he can also try an over-the-counter allergy medicine to help with any nasal

## 2021-08-09 NOTE — ED Triage Notes (Signed)
One week h/o cough. ?Pt reports at the onset he had sore throat and runny nose. Today, sore throat has resolved and runny nose has significantly decreased in occurrence. Has been taking mucinex and alka seltzer w/some relief.  ?

## 2021-08-13 ENCOUNTER — Ambulatory Visit: Payer: Medicare HMO | Admitting: Physician Assistant

## 2021-08-13 ENCOUNTER — Encounter: Payer: Self-pay | Admitting: Physician Assistant

## 2021-08-13 VITALS — BP 124/68 | HR 65 | Ht 74.0 in | Wt 306.0 lb

## 2021-08-13 DIAGNOSIS — E669 Obesity, unspecified: Secondary | ICD-10-CM | POA: Diagnosis not present

## 2021-08-13 DIAGNOSIS — I10 Essential (primary) hypertension: Secondary | ICD-10-CM | POA: Diagnosis not present

## 2021-08-13 DIAGNOSIS — I4892 Unspecified atrial flutter: Secondary | ICD-10-CM | POA: Diagnosis not present

## 2021-08-13 NOTE — Patient Instructions (Signed)
Medication Instructions:  ?No Change ? ?Labwork: ?None  ? ?Testing/Procedures: ?None ? ?Follow-Up: ?Keep scheduled follow up with Dr. Lovena Le ? ?Any Other Special Instructions Will Be Listed Below (If Applicable). ? ? ?Continue to exercise for 150 minutes weekly.  ? ?If you need a refill on your cardiac medications before your next appointment, please call your pharmacy. ? ? ?Two Gram Sodium Diet 2000 mg ? ?What is Sodium? Sodium is a mineral found naturally in many foods. The most significant source of sodium in the diet is table salt, which is about 40% sodium.  Processed, convenience, and preserved foods also contain a large amount of sodium.  The body needs only 500 mg of sodium daily to function,  A normal diet provides more than enough sodium even if you do not use salt. ? ?Why Limit Sodium? A build up of sodium in the body can cause thirst, increased blood pressure, shortness of breath, and water retention.  Decreasing sodium in the diet can reduce edema and risk of heart attack or stroke associated with high blood pressure.  Keep in mind that there are many other factors involved in these health problems.  Heredity, obesity, lack of exercise, cigarette smoking, stress and what you eat all play a role. ? ?General Guidelines: ?Do not add salt at the table or in cooking.  One teaspoon of salt contains over 2 grams of sodium. ?Read food labels ?Avoid processed and convenience foods ?Ask your dietitian before eating any foods not dicussed in the menu planning guidelines ?Consult your physician if you wish to use a salt substitute or a sodium containing medication such as antacids.  Limit milk and milk products to 16 oz (2 cups) per day. ? ?Shopping Hints: ?READ LABELS!! "Dietetic" does not necessarily mean low sodium. ?Salt and other sodium ingredients are often added to foods during processing. ? ? ? ?Menu Planning Guidelines ?Food Group Choose More Often Avoid  ?Beverages (see also the milk group All fruit  juices, low-sodium, salt-free vegetables juices, low-sodium carbonated beverages Regular vegetable or tomato juices, commercially softened water used for drinking or cooking  ?Breads and Cereals Enriched white, wheat, rye and pumpernickel bread, hard rolls and dinner rolls; muffins, cornbread and waffles; most dry cereals, cooked cereal without added salt; unsalted crackers and breadsticks; low sodium or homemade bread crumbs Bread, rolls and crackers with salted tops; quick breads; instant hot cereals; pancakes; commercial bread stuffing; self-rising flower and biscuit mixes; regular bread crumbs or cracker crumbs  ?Desserts and Sweets Desserts and sweets mad with mild should be within allowance Instant pudding mixes and cake mixes  ?Fats Butter or margarine; vegetable oils; unsalted salad dressings, regular salad dressings limited to 1 Tbs; light, sour and heavy cream Regular salad dressings containing bacon fat, bacon bits, and salt pork; snack dips made with instant soup mixes or processed cheese; salted nuts  ?Fruits Most fresh, frozen and canned fruits Fruits processed with salt or sodium-containing ingredient (some dried fruits are processed with sodium sulfites  ? ? ? ? ? ? ?Vegetables Fresh, frozen vegetables and low- sodium canned vegetables Regular canned vegetables, sauerkraut, pickled vegetables, and others prepared in brine; frozen vegetables in sauces; vegetables seasoned with ham, bacon or salt pork  ?Condiments, Sauces, Miscellaneous ? Salt substitute with physician's approval; pepper, herbs, spices; vinegar, lemon or lime juice; hot pepper sauce; garlic powder, onion powder, low sodium soy sauce (1 Tbs.); low sodium condiments (ketchup, chili sauce, mustard) in limited amounts (1 tsp.) fresh ground horseradish; unsalted  tortilla chips, pretzels, potato chips, popcorn, salsa (1/4 cup) Any seasoning made with salt including garlic salt, celery salt, onion salt, and seasoned salt; sea salt, rock salt,  kosher salt; meat tenderizers; monosodium glutamate; mustard, regular soy sauce, barbecue, sauce, chili sauce, teriyaki sauce, steak sauce, Worcestershire sauce, and most flavored vinegars; canned gravy and mixes; regular condiments; salted snack foods, olives, picles, relish, horseradish sauce, catsup  ? ?Food preparation: Try these seasonings ?Meats:    ?Pork Sage, onion Serve with applesauce  ?Chicken Poultry seasoning, thyme, parsley Serve with cranberry sauce  ?Lamb Curry powder, rosemary, garlic, thyme Serve with mint sauce or jelly  ?Veal Marjoram, basil Serve with current jelly, cranberry sauce  ?Beef Pepper, bay leaf Serve with dry mustard, unsalted chive butter  ?[place] leaf, dill Serve with unsalted lemon butter, unsalted parsley butter  ?Vegetables:    ?Asparagus Lemon juice   ?Broccoli Lemon juice   ?Carrots Mustard dressing parsley, mint, nutmeg, glazed with unsalted butter and sugar   ?Green beans Marjoram, lemon juice, nutmeg,dill seed   ?Tomatoes Basil, marjoram, onion   ?Spice /blend for "Salt Shaker" 4 tsp ground thyme ?1 tsp ground sage 3 tsp ground rosemary ?4 tsp ground marjoram  ? ?Test your knowledge ?A product that says "Salt Free" may still contain sodium. True or False ?Garlic Powder and Hot Pepper Sauce an be used as alternative seasonings.True or False ?Processed foods have more sodium than fresh foods.  True or False ?Canned Vegetables have less sodium than froze True or False ? ? ?WAYS TO DECREASE YOUR SODIUM INTAKE ?Avoid the use of added salt in cooking and at the table.  Table salt (and other prepared seasonings which contain salt) is probably one of the greatest sources of sodium in the diet.  Unsalted foods can gain flavor from the sweet, sour, and butter taste sensations of herbs and spices.  Instead of using salt for seasoning, try the following seasonings with the foods listed.  Remember: how you use them to enhance natural food flavors is limited only by your  creativity... ?Allspice-Meat, fish, eggs, fruit, peas, red and yellow vegetables ?Almond Extract-Fruit baked goods ?Anise Seed-Sweet breads, fruit, carrots, beets, cottage cheese, cookies (tastes like licorice) ?Basil-Meat, fish, eggs, vegetables, rice, vegetables salads, soups, sauces ?Bay Leaf-Meat, fish, stews, poultry ?Burnet-Salad, vegetables (cucumber-like flavor) ?Caraway Seed-Bread, cookies, cottage cheese, meat, vegetables, cheese, rice ?Cardamon-Baked goods, fruit, soups ?Celery Powder or seed-Salads, salad dressings, sauces, meatloaf, soup, bread.Do not use  celery salt ?Chervil-Meats, salads, fish, eggs, vegetables, cottage cheese (parsley-like flavor) ?Chili Power-Meatloaf, chicken cheese, corn, eggplant, egg dishes ?Chives-Salads cottage cheese, egg dishes, soups, vegetables, sauces ?Cilantro-Salsa, casseroles ?Cinnamon-Baked goods, fruit, pork, lamb, chicken, carrots ?Cloves-Fruit, baked goods, fish, pot roast, green beans, beets, carrots ?Coriander-Pastry, cookies, meat, salads, cheese (lemon-orange flavor) ?Cumin-Meatloaf, fish,cheese, eggs, cabbage,fruit pie (caraway flavor) ?[employer], fruit, eggs, fish, poultry, cottage cheese, vegetables ?Dill Seed-Meat, cottage cheese, poultry, vegetables, fish, salads, bread ?Fennel Seed-Bread, cookies, apples, pork, eggs, fish, beets, cabbage, cheese, Licorice-like flavor ?Garlic-(buds or powder) Salads, meat, poultry, fish, bread, butter, vegetables, potatoes.Do not  use garlic salt ?Ginger-Fruit, vegetables, baked goods, meat, fish, poultry ?Horseradish Root-Meet, vegetables, butter ?Lemon Juice or Extract-Vegetables, fruit, tea, baked goods, fish salads ?Mace-Baked goods fruit, vegetables, fish, poultry (taste like nutmeg) ?Maple Extract-Syrups ?Marjoram-Meat, chicken, fish, vegetables, breads, green salads (taste like Sage) ?Mint-Tea, lamb, sherbet, vegetables, desserts, carrots, cabbage ?Mustard, Dry or Seed-Cheese, eggs, meats, vegetables,  poultry ?Nutmeg-Baked goods, fruit, chicken, eggs, vegetables, desserts ?Onion Powder-Meat, fish, poultry, vegetables,  cheese, eggs, bread, rice salads (Do not use  ? Onion salt) ?Orange Extract-Desserts, baked goods ?Renelda Loma

## 2021-08-29 ENCOUNTER — Encounter: Payer: Self-pay | Admitting: Internal Medicine

## 2021-08-29 ENCOUNTER — Ambulatory Visit: Payer: Medicare HMO | Admitting: Internal Medicine

## 2021-08-29 VITALS — BP 144/82 | HR 70 | Ht 74.0 in | Wt 309.8 lb

## 2021-08-29 DIAGNOSIS — I4892 Unspecified atrial flutter: Secondary | ICD-10-CM

## 2021-08-29 MED ORDER — APIXABAN 5 MG PO TABS: 5.0000 mg | ORAL_TABLET | Freq: Two times a day (BID) | ORAL | 11 refills | Status: AC

## 2021-08-29 NOTE — Progress Notes (Signed)
HPI Mr. Record is referred by Dr. Harl Bowie for evaluation of atrial fib/flutter. He was hospitalized in March with atypical atrial flutter and also with atrial fib. He spontaneously reverted back to NSR. He has a h/o 2 catheter ablations 10 years ago in [place]. He has a h/o HTN and at 22 has a CHADSVASC score of 2. He was minimally symptomatic out of rhythm. He denies chest pain. Allergies  Allergen Reactions   Amiodarone Rash   Penicillins Rash    Has patient had a PCN reaction causing immediate rash, facial/tongue/throat swelling, SOB or lightheadedness with hypotension: Unknown Has patient had a PCN reaction causing severe rash involving mucus membranes or skin necrosis: No Has patient had a PCN reaction that required hospitalization: No Has patient had a PCN reaction occurring within the last 10 years: No If all of the above answers are "NO", then may proceed with Cephalosporin use.      Current Outpatient Medications  Medication Sig Dispense Refill   albuterol (VENTOLIN HFA) 108 (90 Base) MCG/ACT inhaler Inhale 2 puffs into the lungs every 6 (six) hours as needed for wheezing or shortness of breath. 8 g 0   Ascorbic Acid (VITAMIN C) 1000 MG tablet Take 1,000 mg by mouth daily.     aspirin EC 81 MG tablet Take 81 mg by mouth daily.     Calcium Polycarbophil (FIBER-CAPS PO) Take 5 capsules by mouth daily.     Cholecalciferol (VITAMIN D3) 125 MCG (5000 UT) TABS Take 10,000 Units by mouth daily.      Glucosamine-Chondroit-Vit C-Mn (GLUCOSAMINE 1500 COMPLEX PO) Take 2 tablets by mouth daily.     metoprolol tartrate (LOPRESSOR) 25 MG tablet Take 1 tablet (25 mg total) by mouth 2 (two) times daily. 60 tablet 2   promethazine-dextromethorphan (PROMETHAZINE-DM) 6.25-15 MG/5ML syrup Take 5 mLs by mouth 4 (four) times daily as needed for cough. 140 mL 0   sildenafil (VIAGRA) 50 MG tablet Take 1 tablet (50 mg total) by mouth daily as needed for erectile dysfunction. 10 tablet 2    pramipexole (MIRAPEX) 0.125 MG tablet Take 1 tablet (0.125 mg total) by mouth at bedtime. Take 1 tablet 2 to 3 hours before bedtime. 30 tablet 6   No current facility-administered medications for this visit.     Past Medical History:  Diagnosis Date   Acute medial meniscus tear of right knee    Arthritis    Dysrhythmia    history of AFIB   Obesity (BMI 30-39.9) 02/24/2019   Old bucket handle tear of lateral meniscus of right knee    Testicular failure 02/24/2019   Vitamin D deficiency disease 02/24/2019    ROS:   All systems reviewed and negative except as noted in the HPI.   Past Surgical History:  Procedure Laterality Date   COLONOSCOPY N/A 03/10/2018   Procedure: COLONOSCOPY;  Surgeon: Daneil Dolin, MD;  [facility];  Service: Endoscopy;  Laterality: N/A;  7:30   KNEE ARTHROSCOPY WITH MEDIAL MENISECTOMY Right 01/21/2020   Procedure: KNEE ARTHROSCOPY WITH MEDIAL AND LATERAL MENISECTOMY;  Surgeon: Carole Civil, MD;  Location: [facility];  Service: Orthopedics;  Laterality: Right;   KNEE SURGERY Right 1987   POLYPECTOMY  03/10/2018   Procedure: POLYPECTOMY;  Surgeon: Daneil Dolin, MD;  [facility];  Service: Endoscopy;;  (colon)   Testicle removed Left    VASECTOMY  1989     Family History  Problem Relation Age of Onset  Hypertension Mother    Diabetes Mother    Heart disease Father      Social History   Socioeconomic History   Marital status: Married    Spouse name: Not on file   Number of children: Not on file   Years of education: Not on file   Highest education level: Not on file  Occupational History   Not on file  Tobacco Use   Smoking status: Never   Smokeless tobacco: Never  Vaping Use   Vaping Use: Never used  Substance and Sexual Activity   Alcohol use: Yes    Comment: Occasional   Drug use: Never   Sexual activity: Yes  Other Topics Concern   Not on file  Social History Narrative   Married for 44  years.Lives with wife.Retired August 2020.   Social Determinants of Health   Financial Resource Strain: Not on file  Food Insecurity: Not on file  Transportation Needs: Not on file  Physical Activity: Not on file  Stress: Not on file  Social Connections: Not on file  Intimate Partner Violence: Not on file     BP (!) 144/82   Pulse 70   Ht '6\' 2"'$  (1.88 m)   Wt (!) 309 lb 12.8 oz (140.5 kg)   SpO2 96%   BMI 39.78 kg/m   Physical Exam:  Well appearing NAD HEENT: Unremarkable Neck:  No JVD, no thyromegally Lymphatics:  No adenopathy Back:  No CVA tenderness Lungs:  Clear with no wheezes HEART:  Regular rate rhythm, no murmurs, no rubs, no clicks Abd:  soft, positive bowel sounds, no organomegally, no rebound, no guarding Ext:  2 plus pulses, no edema, no cyanosis, no clubbing Skin:  No rashes no nodules Neuro:  CN II through XII intact, motor grossly intact  Assess/Plan:  PAF - He is minimally symptomatic. I asked him to start taking eliquis as his CHADSVASC is 2. We would consider AA drug therapy if he has more atrial fib/flutter. HTN - his bp is controlled.  Obesity - he is encouraged to lose weight.   Carleene Overlie Jhaden Pizzuto,MD

## 2021-08-29 NOTE — Patient Instructions (Signed)
Medication Instructions:  Start Eliquis 5 mg Two Times Daily   *If you need a refill on your cardiac medications before your next appointment, please call your pharmacy*   Lab Work: NONE   If you have labs (blood work) drawn today and your tests are completely normal, you will receive your results only by: [place] (if you have MyChart) OR A paper copy in the mail If you have any lab test that is abnormal or we need to change your treatment, we will call you to review the results.   Testing/Procedures: NONE    Follow-Up: At [facility], you and your health needs are our priority.  As part of our continuing mission to provide you with exceptional heart care, we have created designated Provider Care Teams.  These Care Teams include your primary Cardiologist (physician) and Advanced Practice Providers (APPs -  Physician Assistants and Nurse Practitioners) who all work together to provide you with the care you need, when you need it.  We recommend signing up for the patient portal called "MyChart".  Sign up information is provided on this After Visit Summary.  MyChart is used to connect with patients for Virtual Visits (Telemedicine).  Patients are able to view lab/test results, encounter notes, upcoming appointments, etc.  Non-urgent messages can be sent to your provider as well.   To learn more about what you can do with MyChart, go to [email or web address].    Your next appointment:   6 month(s)  The format for your next appointment:   In Person  Provider:   Cristopher Peru, MD    Other Instructions Thank you for choosing [facility]!   You Have been given samples today. Lot # [id number] Exp: April 2025  Important Information About Sugar

## 2021-10-10 ENCOUNTER — Other Ambulatory Visit: Payer: Self-pay | Admitting: *Deleted

## 2021-10-10 MED ORDER — METOPROLOL TARTRATE 25 MG PO TABS
25.0000 mg | ORAL_TABLET | Freq: Two times a day (BID) | ORAL | 2 refills | Status: DC
Start: 2021-10-10 — End: 2021-12-03

## 2021-11-07 DIAGNOSIS — I4892 Unspecified atrial flutter: Secondary | ICD-10-CM | POA: Diagnosis not present

## 2021-11-12 ENCOUNTER — Telehealth: Payer: Self-pay | Admitting: Internal Medicine

## 2021-11-12 NOTE — Telephone Encounter (Signed)
STAT if HR is under 50 or over 120 (normal HR is 60-100 beats per minute)  What is your heart rate?    Do you have a log of your heart rate readings (document readings)?  HR was elevated while on a cruise. 8/3 HR got up to 166. Patient saw MD on board and they gave him medication that helped to get HR down. Per wife, MD recommended modifying patient's current medications as well. HR has ranged 64-70 since cruise incident  Do you have any other symptoms?  No

## 2021-11-13 NOTE — Telephone Encounter (Signed)
Returned call to wife. No answer. Left msg to call back.  

## 2021-11-14 NOTE — Telephone Encounter (Signed)
Patient's wife is returning call. 

## 2021-11-14 NOTE — Telephone Encounter (Signed)
Spoke with wife who states that on Aug 2 at 1 am while on a cruise his Hr was 166 BPM. Pt was seen by the medical center on the ship and given meds to bring his heart rate down, and several EKG's done. Pt's Lopressor was increased to 50 mg BID. Wife reports that the patient has been doing well since then. Please advise.

## 2021-11-14 NOTE — Telephone Encounter (Signed)
MyChart message sent to pt to come in for nurse visit

## 2021-11-15 NOTE — Telephone Encounter (Signed)
Patient will come in Tuesday 8/15- late afternoon- for Nurse Visit w/ EKG for elevated hr.

## 2021-11-20 ENCOUNTER — Ambulatory Visit (INDEPENDENT_AMBULATORY_CARE_PROVIDER_SITE_OTHER): Payer: Medicare HMO | Admitting: *Deleted

## 2021-11-20 DIAGNOSIS — I4811 Longstanding persistent atrial fibrillation: Secondary | ICD-10-CM

## 2021-11-20 NOTE — Progress Notes (Signed)
Pt states that he feels fine. No complaints after being seen on the cruise ship.

## 2021-11-29 ENCOUNTER — Telehealth: Payer: Self-pay | Admitting: Internal Medicine

## 2021-11-29 NOTE — Telephone Encounter (Signed)
Returned call to patient's spouse, Izora Gala ([place] per [facility]).  Izora Gala states patient had an episode of A-fib during cruise vacation that was treated by medical staff on the ship. Cruise medical team increased his metoprolol to '50mg'$  BID.   Patient had been on this increased dose since 11/08/21. He came in for an EKG on 11/20/21, appears no changes were made at this visit.  Izora Gala is calling to see if patient should continue on Metoprolol '50mg'$  BID or return to original dose. She reports patient's HR and BP have remained stable on increased dose (HR 60s-70s). She states he has not had any A-fib symptoms on this dose.  Izora Gala also reports she has dropped off information from [place] encounter to the [place] office for Dr. Lovena Le to review.  Will forward to Dr. Lovena Le to review and advise on recommended dose of metoprolol.

## 2021-11-29 NOTE — Telephone Encounter (Signed)
Pt c/o medication issue:  1. Name of Medication: Metoprolol  2. How are you currently taking this medication (dosage and times per day)? 50 mg 2 times a day  3. Are you having a reaction (difficulty breathing--STAT)? no  4. What is your medication issue? Patient had an episode when he was away on vacation and his Metoprolol was increased to the 50 mg 2 times a day. Patient wants to know if he needs  to continuing taking this dose? If so, he will need a new prescription

## 2021-11-30 NOTE — Telephone Encounter (Signed)
Yes I would suggest the higher dose of metoprolol. GT

## 2021-12-03 MED ORDER — METOPROLOL TARTRATE 50 MG PO TABS: 50.0000 mg | ORAL_TABLET | Freq: Two times a day (BID) | ORAL | 3 refills | Status: AC

## 2021-12-03 NOTE — Telephone Encounter (Signed)
Spoke to pt and verbalized dose increase for medication. Pt voiced understanding. Medication updated in pt chart and new rx sent to pharmacy.

## 2021-12-12 ENCOUNTER — Ambulatory Visit: Payer: Medicare HMO | Admitting: Internal Medicine

## 2022-01-02 ENCOUNTER — Ambulatory Visit (INDEPENDENT_AMBULATORY_CARE_PROVIDER_SITE_OTHER): Payer: Medicare HMO | Admitting: Internal Medicine

## 2022-01-02 ENCOUNTER — Encounter: Payer: Self-pay | Admitting: Internal Medicine

## 2022-01-02 VITALS — BP 128/84 | HR 84 | Resp 18 | Ht 75.0 in | Wt 312.2 lb

## 2022-01-02 DIAGNOSIS — G4761 Periodic limb movement disorder: Secondary | ICD-10-CM

## 2022-01-02 DIAGNOSIS — Z7901 Long term (current) use of anticoagulants: Secondary | ICD-10-CM | POA: Diagnosis not present

## 2022-01-02 DIAGNOSIS — Z23 Encounter for immunization: Secondary | ICD-10-CM | POA: Diagnosis not present

## 2022-01-02 DIAGNOSIS — I4891 Unspecified atrial fibrillation: Secondary | ICD-10-CM

## 2022-01-02 NOTE — Assessment & Plan Note (Signed)
His history is more consistent with periodic limb movement than restless legs or arms Referred to neurology for further evaluation - on pramipexole now

## 2022-01-02 NOTE — Progress Notes (Addendum)
Established Patient Office Visit  Subjective:  Patient ID: Harold West, male    DOB: 12-29-1950  Age: 71 y.o. MRN: [id number]  CC:  Chief Complaint  Patient presents with   Follow-up    Follow up myoclonic jerk     HPI Harold West is a 71 y.o. male with past medical history of atrial fibrillation s/p cardiac ablation who presents for f/u of his chronic medical conditions.  A-fib s/p cardiac ablation: He had an episode of A-fib with RVR, and has been placed on metoprolol and Eliquis now.  Denies any active signs of bleeding.  Denies any chest pain, dyspnea or palpitations.  Followed by cardiology.  He has been placed on pramipexole for periodic limb movement by neurology.  He states that he still has jerking movements of his legs on an intermittent basis, but has seen some improvement with pramipexole.  Past Medical History:  Diagnosis Date   Acute medial meniscus tear of right knee    Arthritis    Dysrhythmia    history of AFIB   Obesity (BMI 30-39.9) 02/24/2019   Old bucket handle tear of lateral meniscus of right knee    Testicular failure 02/24/2019   Vitamin D deficiency disease 02/24/2019    Past Surgical History:  Procedure Laterality Date   COLONOSCOPY N/A 03/10/2018   Procedure: COLONOSCOPY;  Surgeon: Daneil Dolin, MD;  Location: [facility];  Service: Endoscopy;  Laterality: N/A;  7:30   KNEE ARTHROSCOPY WITH MEDIAL MENISECTOMY Right 01/21/2020   Procedure: KNEE ARTHROSCOPY WITH MEDIAL AND LATERAL MENISECTOMY;  Surgeon: Carole Civil, MD;  Location: [facility];  Service: Orthopedics;  Laterality: Right;   KNEE SURGERY Right 1987   POLYPECTOMY  03/10/2018   Procedure: POLYPECTOMY;  Surgeon: Daneil Dolin, MD;  Location: [facility];  Service: Endoscopy;;  (colon)   Testicle removed Left    VASECTOMY  1989    Family History  Problem Relation Age of Onset   Hypertension Mother    Diabetes Mother    Heart disease Father     Social History    Socioeconomic History   Marital status: Married    Spouse name: Not on file   Number of children: Not on file   Years of education: Not on file   Highest education level: Not on file  Occupational History   Not on file  Tobacco Use   Smoking status: Never   Smokeless tobacco: Never  Vaping Use   Vaping Use: Never used  Substance and Sexual Activity   Alcohol use: Yes    Comment: Occasional   Drug use: Never   Sexual activity: Yes  Other Topics Concern   Not on file  Social History Narrative   Married for 44 years.Lives with wife.Retired August 2020.   Social Determinants of Health   Financial Resource Strain: Not on file  Food Insecurity: Not on file  Transportation Needs: Not on file  Physical Activity: Not on file  Stress: Not on file  Social Connections: Not on file  Intimate Partner Violence: Not on file    Outpatient Medications Prior to Visit  Medication Sig Dispense Refill   albuterol (VENTOLIN HFA) 108 (90 Base) MCG/ACT inhaler Inhale 2 puffs into the lungs every 6 (six) hours as needed for wheezing or shortness of breath. 8 g 0   apixaban (ELIQUIS) 5 MG TABS tablet Take 1 tablet (5 mg total) by mouth 2 (two) times daily. 60 tablet 11   Ascorbic Acid (  VITAMIN C) 1000 MG tablet Take 1,000 mg by mouth daily.     Calcium Polycarbophil (FIBER-CAPS PO) Take 5 capsules by mouth daily.     Cholecalciferol (VITAMIN D3) 125 MCG (5000 UT) TABS Take 5,000 Units by mouth daily.     Glucosamine-Chondroit-Vit C-Mn (GLUCOSAMINE 1500 COMPLEX PO) Take 2 tablets by mouth daily.     metoprolol tartrate (LOPRESSOR) 50 MG tablet Take 1 tablet (50 mg total) by mouth 2 (two) times daily. 180 tablet 3   sildenafil (VIAGRA) 50 MG tablet Take 1 tablet (50 mg total) by mouth daily as needed for erectile dysfunction. 10 tablet 2   pramipexole (MIRAPEX) 0.125 MG tablet Take 1 tablet (0.125 mg total) by mouth at bedtime. Take 1 tablet 2 to 3 hours before bedtime. 30 tablet 6    promethazine-dextromethorphan (PROMETHAZINE-DM) 6.25-15 MG/5ML syrup Take 5 mLs by mouth 4 (four) times daily as needed for cough. (Patient not taking: Reported on 01/02/2022) 140 mL 0   No facility-administered medications prior to visit.    Allergies  Allergen Reactions   Amiodarone Rash   Penicillins Rash    Has patient had a PCN reaction causing immediate rash, facial/tongue/throat swelling, SOB or lightheadedness with hypotension: Unknown Has patient had a PCN reaction causing severe rash involving mucus membranes or skin necrosis: No Has patient had a PCN reaction that required hospitalization: No Has patient had a PCN reaction occurring within the last 10 years: No If all of the above answers are "NO", then may proceed with Cephalosporin use.     ROS Review of Systems  Constitutional:  Negative for chills and fever.  HENT:  Negative for congestion and sore throat.   Eyes:  Negative for pain and discharge.  Respiratory:  Negative for cough and shortness of breath.   Cardiovascular:  Negative for chest pain and palpitations.  Gastrointestinal:  Negative for constipation, diarrhea, nausea and vomiting.  Endocrine: Negative for polydipsia and polyuria.  Genitourinary:  Negative for dysuria and hematuria.  Musculoskeletal:  Negative for neck pain and neck stiffness.  Skin:  Negative for rash.  Neurological:  Negative for dizziness, weakness, numbness and headaches.  Psychiatric/Behavioral:  Negative for agitation and behavioral problems.       Objective:    Physical Exam Vitals reviewed.  Constitutional:      General: He is not in acute distress.    Appearance: He is not diaphoretic.  HENT:     Head: Normocephalic and atraumatic.     Nose: Nose normal.     Mouth/Throat:     Mouth: Mucous membranes are moist.  Eyes:     General: No scleral icterus.    Extraocular Movements: Extraocular movements intact.  Cardiovascular:     Rate and Rhythm: Normal rate. Rhythm  irregular.     Pulses: Normal pulses.     Heart sounds: Normal heart sounds. No murmur heard. Pulmonary:     Breath sounds: Normal breath sounds. No wheezing or rales.  Musculoskeletal:     Cervical back: Neck supple. No tenderness.     Right lower leg: No edema.     Left lower leg: No edema.  Skin:    General: Skin is warm.     Findings: No rash.  Neurological:     General: No focal deficit present.     Mental Status: He is alert and oriented to person, place, and time.     Cranial Nerves: No cranial nerve deficit.     Sensory: No sensory deficit.  Motor: No weakness.  Psychiatric:        Mood and Affect: Mood normal.        Behavior: Behavior normal.     BP 128/84 (BP Location: Left Arm, Patient Position: Sitting, Cuff Size: Normal)   Pulse 84   Resp 18   Ht 6' 3"  (1.905 m)   Wt (!) 312 lb 3.2 oz (141.6 kg)   SpO2 99%   BMI 39.02 kg/m  Wt Readings from Last 3 Encounters:  01/02/22 (!) 312 lb 3.2 oz (141.6 kg)  08/29/21 (!) 309 lb 12.8 oz (140.5 kg)  08/13/21 (!) 306 lb (138.8 kg)    Lab Results  Component Value Date   TSH 1.156 07/04/2021   Lab Results  Component Value Date   WBC 7.0 07/05/2021   HGB 12.6 (L) 07/05/2021   HCT 40.7 07/05/2021   MCV 88.1 07/05/2021   PLT 168 07/05/2021   Lab Results  Component Value Date   NA 139 07/05/2021   K 4.2 07/05/2021   CO2 23 07/05/2021   GLUCOSE 92 07/05/2021   BUN 18 07/05/2021   CREATININE 0.80 07/05/2021   BILITOT 1.2 07/04/2021   ALKPHOS 50 07/04/2021   AST 26 07/04/2021   ALT 27 07/04/2021   PROT 7.1 07/04/2021   ALBUMIN 4.2 07/04/2021   CALCIUM 8.8 (L) 07/05/2021   ANIONGAP 6 07/05/2021   EGFR 87 06/14/2021   Lab Results  Component Value Date   CHOL 102 06/14/2021   Lab Results  Component Value Date   HDL 40 06/14/2021   Lab Results  Component Value Date   LDLCALC 51 06/14/2021   Lab Results  Component Value Date   TRIG 42 06/14/2021   Lab Results  Component Value Date   CHOLHDL  2.6 06/14/2021   Lab Results  Component Value Date   HGBA1C 5.7 (H) 06/14/2021      Assessment & Plan:   Problem List Items Addressed This Visit       Cardiovascular and Mediastinum   Atrial fibrillation ([place]) - Primary    Rate controlled with metoprolol On Eliquis for [facility] Denies any active signs of bleeding Followed by Cardiology        Other   Periodic limb movement    His history is more consistent with periodic limb movement than restless legs or arms Referred to neurology for further evaluation - on pramipexole now      Relevant Orders   CBC with Differential/Platelet   Basic Metabolic Panel (BMET)   Other Visit Diagnoses     Need for immunization against influenza       Relevant Orders   Flu Vaccine QUAD High Dose(Fluad) (Completed)   Chronic anticoagulation       Relevant Orders   CBC with Differential/Platelet       No orders of the defined types were placed in this encounter.   Follow-up: Return in about 6 months (around 07/03/2022) for Annual physical.    Lindell Spar, MD

## 2022-01-02 NOTE — Patient Instructions (Addendum)
Please continue taking medications as prescribed.  Please continue to follow low salt diet and perform moderate exercise/walking as tolerated.  Please consider getting Tdap vaccine at local pharmacy.

## 2022-01-02 NOTE — Assessment & Plan Note (Signed)
Rate controlled with metoprolol On Eliquis for [facility] Denies any active signs of bleeding Followed by Cardiology

## 2022-01-03 LAB — CBC WITH DIFFERENTIAL/PLATELET
Basophils Absolute: 0.1 10*3/uL (ref 0.0–0.2)
Basos: 1 %
EOS (ABSOLUTE): 0.1 10*3/uL (ref 0.0–0.4)
Eos: 2 %
Hematocrit: 49 % (ref 37.5–51.0)
Hemoglobin: 15.8 g/dL (ref 13.0–17.7)
Immature Grans (Abs): 0 10*3/uL (ref 0.0–0.1)
Immature Granulocytes: 0 %
Lymphocytes Absolute: 2.2 10*3/uL (ref 0.7–3.1)
Lymphs: 24 %
MCH: 27.6 pg (ref 26.6–33.0)
MCHC: 32.2 g/dL (ref 31.5–35.7)
MCV: 86 fL (ref 79–97)
Monocytes Absolute: 0.7 10*3/uL (ref 0.1–0.9)
Monocytes: 8 %
Neutrophils Absolute: 5.8 10*3/uL (ref 1.4–7.0)
Neutrophils: 65 %
Platelets: 224 10*3/uL (ref 150–450)
RBC: 5.73 x10E6/uL (ref 4.14–5.80)
RDW: 13.4 % (ref 11.6–15.4)
WBC: 9 10*3/uL (ref 3.4–10.8)

## 2022-01-03 LAB — BASIC METABOLIC PANEL
BUN/Creatinine Ratio: 14 (ref 10–24)
BUN: 17 mg/dL (ref 8–27)
CO2: 22 mmol/L (ref 20–29)
Calcium: 10 mg/dL (ref 8.6–10.2)
Chloride: 102 mmol/L (ref 96–106)
Creatinine, Ser: 1.18 mg/dL (ref 0.76–1.27)
Glucose: 106 mg/dL — ABNORMAL HIGH (ref 70–99)
Potassium: 5 mmol/L (ref 3.5–5.2)
Sodium: 141 mmol/L (ref 134–144)
eGFR: 67 mL/min/{1.73_m2} (ref >59)

## 2022-01-15 ENCOUNTER — Ambulatory Visit: Payer: Medicare HMO | Admitting: Podiatry

## 2022-01-15 ENCOUNTER — Encounter: Payer: Self-pay | Admitting: Podiatry

## 2022-01-15 DIAGNOSIS — M79674 Pain in right toe(s): Secondary | ICD-10-CM

## 2022-01-15 DIAGNOSIS — B351 Tinea unguium: Secondary | ICD-10-CM | POA: Diagnosis not present

## 2022-01-15 DIAGNOSIS — M79675 Pain in left toe(s): Secondary | ICD-10-CM | POA: Diagnosis not present

## 2022-01-15 NOTE — Progress Notes (Signed)
  Subjective:  Patient ID: Harold West, male    DOB: Aug 23, 1950,  MRN: [id number]  Chief Complaint  Patient presents with   Nail Problem    "Toenails clipped."   Callouses    "Callus scraped."   71 y.o. male returns for the above complaint.  Patient presents with thickened elongated dystrophic toenails x10 mild pain on palpation.  He is not able to debride himself.  He would like for me to do it.  He denies any other acute complaints  Objective:  There were no vitals filed for this visit. Podiatric Exam: Vascular: dorsalis pedis and posterior tibial pulses are palpable bilateral. Capillary return is immediate. Temperature gradient is WNL. Skin turgor WNL  Sensorium: Normal Semmes Weinstein monofilament test. Normal tactile sensation bilaterally. Nail Exam: Pt has thick disfigured discolored nails with subungual debris noted bilateral entire nail hallux through fifth toenails.  Pain on palpation to the nails. Ulcer Exam: There is no evidence of ulcer or pre-ulcerative changes or infection. Orthopedic Exam: Muscle tone and strength are WNL. No limitations in general ROM. No crepitus or effusions noted.  Skin: No Porokeratosis. No infection or ulcers    Assessment & Plan:   1. Pain due to onychomycosis of toenails of both feet      Patient was evaluated and treated and all questions answered.  Onychomycosis with pain  -Nails palliatively debrided as below. -Educated on self-care  Procedure: Nail Debridement Rationale: pain  Type of Debridement: manual, sharp debridement. Instrumentation: Nail nipper, rotary burr. Number of Nails: 10  Procedures and Treatment: Consent by patient was obtained for treatment procedures. The patient understood the discussion of treatment and procedures well. All questions were answered thoroughly reviewed. Debridement of mycotic and hypertrophic toenails, 1 through 5 bilateral and clearing of subungual debris. No ulceration, no infection noted.   Return Visit-Office Procedure: Patient instructed to return to the office for a follow up visit 3 months for continued evaluation and treatment.  Boneta Lucks, DPM    No follow-ups on file.

## 2022-01-21 ENCOUNTER — Telehealth: Payer: Self-pay | Admitting: Internal Medicine

## 2022-01-21 DIAGNOSIS — R079 Chest pain, unspecified: Secondary | ICD-10-CM | POA: Diagnosis not present

## 2022-01-21 DIAGNOSIS — I4891 Unspecified atrial fibrillation: Secondary | ICD-10-CM | POA: Diagnosis not present

## 2022-01-21 DIAGNOSIS — Z888 Allergy status to other drugs, medicaments and biological substances status: Secondary | ICD-10-CM | POA: Diagnosis not present

## 2022-01-21 DIAGNOSIS — I4892 Unspecified atrial flutter: Secondary | ICD-10-CM | POA: Diagnosis not present

## 2022-01-21 DIAGNOSIS — Z6841 Body Mass Index (BMI) 40.0 and over, adult: Secondary | ICD-10-CM | POA: Diagnosis not present

## 2022-01-21 DIAGNOSIS — I471 Supraventricular tachycardia, unspecified: Secondary | ICD-10-CM | POA: Diagnosis not present

## 2022-01-21 DIAGNOSIS — Z7901 Long term (current) use of anticoagulants: Secondary | ICD-10-CM | POA: Diagnosis not present

## 2022-01-21 DIAGNOSIS — Z79899 Other long term (current) drug therapy: Secondary | ICD-10-CM | POA: Diagnosis not present

## 2022-01-21 DIAGNOSIS — I959 Hypotension, unspecified: Secondary | ICD-10-CM | POA: Diagnosis not present

## 2022-01-21 DIAGNOSIS — Z20822 Contact with and (suspected) exposure to covid-19: Secondary | ICD-10-CM | POA: Diagnosis not present

## 2022-01-21 DIAGNOSIS — I119 Hypertensive heart disease without heart failure: Secondary | ICD-10-CM | POA: Diagnosis not present

## 2022-01-21 DIAGNOSIS — I483 Typical atrial flutter: Secondary | ICD-10-CM | POA: Diagnosis not present

## 2022-01-21 DIAGNOSIS — I21A1 Myocardial infarction type 2: Secondary | ICD-10-CM | POA: Diagnosis not present

## 2022-01-21 DIAGNOSIS — Z880 Allergy status to penicillin: Secondary | ICD-10-CM | POA: Diagnosis not present

## 2022-01-21 NOTE — Telephone Encounter (Signed)
STAT if HR is under 50 or over 120 (normal HR is 60-100 beats per minute)  What is your heart rate? 161  Do you have a log of your heart rate readings (document readings)? 155, 165, 95, 161, 122  Do you have any other symptoms? Wife states that patient doesn't feel well.  Patient c/o Palpitations:  High priority if patient c/o lightheadedness, shortness of breath, or chest pain  How long have you had palpitations/irregular HR/ Afib? Are you having the symptoms now? Since 6 a.m. this morning   Are you currently experiencing lightheadedness, SOB or CP? No  Do you have a history of afib (atrial fibrillation) or irregular heart rhythm? Yes  Have you checked your BP or HR? (document readings if available): 117/97  Are you experiencing any other symptoms? No

## 2022-01-21 NOTE — Telephone Encounter (Signed)
Spoke with pt's wife who states that they just moved this weekend to [place]. Pt woke up this morning feeling bad. Current BP is 117/90 and HR is 161. Pt denies chest pain and SOB at this time. Pt encouraged to be seen in ER for eval. Please advise.

## 2022-01-22 DIAGNOSIS — I4891 Unspecified atrial fibrillation: Secondary | ICD-10-CM | POA: Diagnosis not present

## 2022-01-22 DIAGNOSIS — I119 Hypertensive heart disease without heart failure: Secondary | ICD-10-CM | POA: Diagnosis not present

## 2022-01-22 DIAGNOSIS — I483 Typical atrial flutter: Secondary | ICD-10-CM | POA: Diagnosis not present

## 2022-01-23 DIAGNOSIS — I4891 Unspecified atrial fibrillation: Secondary | ICD-10-CM | POA: Diagnosis not present

## 2022-01-23 DIAGNOSIS — I483 Typical atrial flutter: Secondary | ICD-10-CM | POA: Diagnosis not present

## 2022-01-23 DIAGNOSIS — I119 Hypertensive heart disease without heart failure: Secondary | ICD-10-CM | POA: Diagnosis not present

## 2022-01-24 ENCOUNTER — Encounter: Payer: Self-pay | Admitting: *Deleted

## 2022-01-24 ENCOUNTER — Telehealth: Payer: Self-pay | Admitting: *Deleted

## 2022-01-24 NOTE — Patient Outreach (Signed)
  Care Coordination [facility] Note Transition Care Management Follow-up Telephone Call Date of discharge and from where: 01/23/22 from [facility] How have you been since you were released from the hospital? "I'm doing very well. Thank you for calling." Any questions or concerns? No  Items Reviewed: Did the pt receive and understand the discharge instructions provided? Yes  Medications obtained and verified? Yes . No med changes Other? Yes cardiac ablation 11 years ago that successfully managed Afib until he had Covid. Moved to [place] area on 01/18/22. The hospital system is assisting with a cardiac referral and considering an ablation again since it was so successful last time. His son is a PA and lives locally. He is going to assist in setting him up with a new PCP.  Any new allergies since your discharge? No  Dietary orders reviewed? Yes Do you have support at home? Yes   Home Care and Equipment/Supplies: Were home health services ordered? no If so, what is the name of the agency? N/a  Has the agency set up a time to come to the patient's home? not applicable Were any new equipment or medical supplies ordered?  No What is the name of the medical supply agency? N/a Were you able to get the supplies/equipment? not applicable Do you have any questions related to the use of the equipment or supplies? No  Functional Questionnaire: (I = Independent and D = Dependent) ADLs: I  Bathing/Dressing- I  Meal Prep- I  Eating- I  Maintaining continence- I  Transferring/Ambulation- I  Managing Meds- I  Follow up appointments reviewed:  PCP Hospital f/u appt confirmed?  Patient has moved out of the area and has not scheduled with a PCP yet, but will soon.   [facility] f/u appt confirmed?  Patient has moved out of the area and cardiology referral is pending.    Are transportation arrangements needed? No  If their condition worsens, is the pt aware to call PCP or go to  the Emergency Dept.? Yes Was the patient provided with contact information for the PCP's office or ED? Yes Was to pt encouraged to call back with questions or concerns? Yes  SDOH assessments and interventions completed:   Yes  Care Coordination Interventions Activated:  No   Care Coordination Interventions:  No Care Coordination interventions needed at this time.   Encounter Outcome:  Pt. Visit Completed    Chong Sicilian, BSN, RN-BC RN Care Coordinator [place] Direct Dial: [phone] Main #: [phone]

## 2022-01-25 ENCOUNTER — Telehealth: Payer: Self-pay | Admitting: *Deleted

## 2022-01-25 NOTE — Telephone Encounter (Signed)
LVM for patient to call office for appointment

## 2022-01-25 NOTE — Telephone Encounter (Signed)
Pt needs TOC appt scheduled D/C 10/18 TOC call already made

## 2022-01-31 ENCOUNTER — Inpatient Hospital Stay: Payer: Medicare HMO | Admitting: Internal Medicine

## 2022-01-31 NOTE — Telephone Encounter (Signed)
Sounds like he is out of rhythm. Agree with ED eval. GT

## 2022-02-22 ENCOUNTER — Other Ambulatory Visit: Payer: Self-pay

## 2022-02-22 MED ORDER — PRAMIPEXOLE DIHYDROCHLORIDE 0.125 MG PO TABS
0.1250 mg | ORAL_TABLET | Freq: Every evening | ORAL | 0 refills | Status: DC
Start: 2022-02-22 — End: 2022-03-03

## 2022-03-03 ENCOUNTER — Other Ambulatory Visit: Payer: Self-pay | Admitting: Internal Medicine

## 2022-03-03 DIAGNOSIS — G4761 Periodic limb movement disorder: Secondary | ICD-10-CM

## 2022-03-03 MED ORDER — PRAMIPEXOLE DIHYDROCHLORIDE 0.125 MG PO TABS: 0.1250 mg | ORAL_TABLET | Freq: Every evening | ORAL | 1 refills | Status: AC

## 2022-03-04 DIAGNOSIS — I493 Ventricular premature depolarization: Secondary | ICD-10-CM | POA: Diagnosis not present

## 2022-03-04 DIAGNOSIS — I480 Paroxysmal atrial fibrillation: Secondary | ICD-10-CM | POA: Diagnosis not present

## 2022-03-04 DIAGNOSIS — I4892 Unspecified atrial flutter: Secondary | ICD-10-CM | POA: Diagnosis not present

## 2022-03-04 DIAGNOSIS — I119 Hypertensive heart disease without heart failure: Secondary | ICD-10-CM | POA: Diagnosis not present

## 2022-03-05 ENCOUNTER — Ambulatory Visit: Payer: Medicare HMO | Admitting: Internal Medicine

## 2022-03-14 DIAGNOSIS — I493 Ventricular premature depolarization: Secondary | ICD-10-CM | POA: Diagnosis not present

## 2022-03-18 DIAGNOSIS — H539 Unspecified visual disturbance: Secondary | ICD-10-CM | POA: Diagnosis not present

## 2022-03-18 DIAGNOSIS — G2581 Restless legs syndrome: Secondary | ICD-10-CM | POA: Diagnosis not present

## 2022-03-18 DIAGNOSIS — Z9181 History of falling: Secondary | ICD-10-CM | POA: Diagnosis not present

## 2022-03-18 DIAGNOSIS — R232 Flushing: Secondary | ICD-10-CM | POA: Diagnosis not present

## 2022-03-18 DIAGNOSIS — E559 Vitamin D deficiency, unspecified: Secondary | ICD-10-CM | POA: Diagnosis not present

## 2022-03-18 DIAGNOSIS — Z9889 Other specified postprocedural states: Secondary | ICD-10-CM | POA: Diagnosis not present

## 2022-03-18 DIAGNOSIS — Z7689 Persons encountering health services in other specified circumstances: Secondary | ICD-10-CM | POA: Diagnosis not present

## 2022-03-18 DIAGNOSIS — K036 Deposits [accretions] on teeth: Secondary | ICD-10-CM | POA: Diagnosis not present

## 2022-03-18 DIAGNOSIS — N529 Male erectile dysfunction, unspecified: Secondary | ICD-10-CM | POA: Diagnosis not present

## 2022-03-18 DIAGNOSIS — I4811 Longstanding persistent atrial fibrillation: Secondary | ICD-10-CM | POA: Diagnosis not present

## 2022-03-22 DIAGNOSIS — H5203 Hypermetropia, bilateral: Secondary | ICD-10-CM | POA: Diagnosis not present

## 2022-04-05 DIAGNOSIS — R079 Chest pain, unspecified: Secondary | ICD-10-CM | POA: Diagnosis not present

## 2022-04-11 DIAGNOSIS — I480 Paroxysmal atrial fibrillation: Secondary | ICD-10-CM | POA: Diagnosis not present

## 2022-04-26 DIAGNOSIS — I480 Paroxysmal atrial fibrillation: Secondary | ICD-10-CM | POA: Diagnosis not present

## 2022-04-26 DIAGNOSIS — I4892 Unspecified atrial flutter: Secondary | ICD-10-CM | POA: Diagnosis not present

## 2022-06-06 ENCOUNTER — Encounter: Payer: Self-pay | Admitting: Radiology

## 2022-07-03 ENCOUNTER — Ambulatory Visit: Payer: Medicare HMO | Admitting: Physician Assistant

## 2022-07-03 ENCOUNTER — Encounter: Payer: Medicare HMO | Admitting: Internal Medicine

## 2023-02-21 ENCOUNTER — Encounter: Payer: Self-pay | Admitting: *Deleted
